# Patient Record
Sex: Female | Born: 1985 | Race: White | Hispanic: No | Marital: Married | Smoking: Never smoker
Health system: Southern US, Community
[De-identification: ages and names within clinical notes are randomized; demographics above are authoritative.]

## PROBLEM LIST (undated history)

## (undated) DIAGNOSIS — Z124 Encounter for screening for malignant neoplasm of cervix: Secondary | ICD-10-CM

## (undated) DIAGNOSIS — E669 Obesity, unspecified: Secondary | ICD-10-CM

## (undated) DIAGNOSIS — E781 Pure hyperglyceridemia: Secondary | ICD-10-CM

## (undated) DIAGNOSIS — E66811 Obesity, class 1: Secondary | ICD-10-CM

## (undated) DIAGNOSIS — C4491 Basal cell carcinoma of skin, unspecified: Secondary | ICD-10-CM

## (undated) DIAGNOSIS — I10 Essential (primary) hypertension: Secondary | ICD-10-CM

## (undated) DIAGNOSIS — E039 Hypothyroidism, unspecified: Secondary | ICD-10-CM

## (undated) DIAGNOSIS — K219 Gastro-esophageal reflux disease without esophagitis: Secondary | ICD-10-CM

## (undated) DIAGNOSIS — E282 Polycystic ovarian syndrome: Secondary | ICD-10-CM

## (undated) HISTORY — DX: Obesity, class 1: E66.811

## (undated) HISTORY — DX: Pure hyperglyceridemia: E78.1

## (undated) HISTORY — DX: Obesity, unspecified: E66.9

## (undated) HISTORY — PX: WISDOM TOOTH EXTRACTION: SHX21

## (undated) HISTORY — DX: Encounter for screening for malignant neoplasm of cervix: Z12.4

## (undated) HISTORY — DX: Polycystic ovarian syndrome: E28.2

## (undated) HISTORY — DX: Essential (primary) hypertension: I10

## (undated) HISTORY — DX: Hypercalcemia: E83.52

## (undated) HISTORY — DX: Basal cell carcinoma of skin, unspecified: C44.91

---

## 2013-07-24 DIAGNOSIS — N80109 Endometriosis of ovary, unspecified side, unspecified depth: Secondary | ICD-10-CM | POA: Insufficient documentation

## 2013-11-26 HISTORY — PX: ENDOMETRIAL FULGURATION: SHX1500

## 2015-03-25 LAB — OB RESULTS CONSOLE ABO/RH: RH TYPE: NEGATIVE

## 2015-03-25 LAB — OB RESULTS CONSOLE HIV ANTIBODY (ROUTINE TESTING): HIV: NONREACTIVE

## 2015-03-25 LAB — OB RESULTS CONSOLE HEPATITIS B SURFACE ANTIGEN: Hepatitis B Surface Ag: NEGATIVE

## 2015-03-25 LAB — OB RESULTS CONSOLE RPR: RPR: NONREACTIVE

## 2015-03-25 LAB — OB RESULTS CONSOLE RUBELLA ANTIBODY, IGM: RUBELLA: IMMUNE

## 2015-09-09 ENCOUNTER — Inpatient Hospital Stay (HOSPITAL_COMMUNITY): Payer: BC Managed Care – PPO

## 2015-09-09 ENCOUNTER — Inpatient Hospital Stay (HOSPITAL_COMMUNITY): Payer: BC Managed Care – PPO | Admitting: Anesthesiology

## 2015-09-09 ENCOUNTER — Inpatient Hospital Stay (HOSPITAL_COMMUNITY)
Admission: AD | Admit: 2015-09-09 | Discharge: 2015-09-12 | DRG: 766 | Disposition: A | Discharge status: Home / Self Care | Payer: BC Managed Care – PPO | Source: Ambulatory Visit | Attending: Obstetrics and Gynecology | Admitting: Obstetrics and Gynecology

## 2015-09-09 ENCOUNTER — Encounter (HOSPITAL_COMMUNITY): Payer: Self-pay | Admitting: *Deleted

## 2015-09-09 ENCOUNTER — Encounter (HOSPITAL_COMMUNITY)
Admission: AD | Disposition: A | Discharge status: Home / Self Care | Payer: Self-pay | Source: Ambulatory Visit | Attending: Obstetrics and Gynecology

## 2015-09-09 DIAGNOSIS — E039 Hypothyroidism, unspecified: Secondary | ICD-10-CM | POA: Diagnosis present

## 2015-09-09 DIAGNOSIS — O99284 Endocrine, nutritional and metabolic diseases complicating childbirth: Secondary | ICD-10-CM | POA: Diagnosis present

## 2015-09-09 DIAGNOSIS — O4400 Placenta previa specified as without hemorrhage, unspecified trimester: Secondary | ICD-10-CM | POA: Diagnosis present

## 2015-09-09 DIAGNOSIS — O9962 Diseases of the digestive system complicating childbirth: Secondary | ICD-10-CM | POA: Diagnosis present

## 2015-09-09 DIAGNOSIS — O114 Pre-existing hypertension with pre-eclampsia, complicating childbirth: Secondary | ICD-10-CM | POA: Diagnosis present

## 2015-09-09 DIAGNOSIS — K219 Gastro-esophageal reflux disease without esophagitis: Secondary | ICD-10-CM | POA: Diagnosis present

## 2015-09-09 DIAGNOSIS — O4403 Placenta previa specified as without hemorrhage, third trimester: Secondary | ICD-10-CM

## 2015-09-09 DIAGNOSIS — O1413 Severe pre-eclampsia, third trimester: Secondary | ICD-10-CM | POA: Diagnosis not present

## 2015-09-09 DIAGNOSIS — O4423 Partial placenta previa NOS or without hemorrhage, third trimester: Secondary | ICD-10-CM | POA: Diagnosis present

## 2015-09-09 DIAGNOSIS — Z3A36 36 weeks gestation of pregnancy: Secondary | ICD-10-CM

## 2015-09-09 DIAGNOSIS — R030 Elevated blood-pressure reading, without diagnosis of hypertension: Secondary | ICD-10-CM | POA: Diagnosis present

## 2015-09-09 HISTORY — DX: Hypothyroidism, unspecified: E03.9

## 2015-09-09 HISTORY — DX: Gastro-esophageal reflux disease without esophagitis: K21.9

## 2015-09-09 LAB — CBC
HEMATOCRIT: 36.3 % (ref 36.0–46.0)
Hemoglobin: 11.4 g/dL — ABNORMAL LOW (ref 12.0–15.0)
MCH: 25.9 pg — AB (ref 26.0–34.0)
MCHC: 31.4 g/dL (ref 30.0–36.0)
MCV: 82.3 fL (ref 78.0–100.0)
PLATELETS: 191 10*3/uL (ref 150–400)
RBC: 4.41 MIL/uL (ref 3.87–5.11)
RDW: 17.7 % — AB (ref 11.5–15.5)
WBC: 12.4 10*3/uL — ABNORMAL HIGH (ref 4.0–10.5)

## 2015-09-09 LAB — URINALYSIS, ROUTINE W REFLEX MICROSCOPIC
BILIRUBIN URINE: NEGATIVE
GLUCOSE, UA: NEGATIVE mg/dL
Ketones, ur: NEGATIVE mg/dL
Leukocytes, UA: NEGATIVE
Nitrite: NEGATIVE
PROTEIN: 30 mg/dL — AB
Specific Gravity, Urine: 1.01 (ref 1.005–1.030)
UROBILINOGEN UA: 0.2 mg/dL (ref 0.0–1.0)
pH: 6.5 (ref 5.0–8.0)

## 2015-09-09 LAB — PROTEIN / CREATININE RATIO, URINE
CREATININE, URINE: 59 mg/dL
Protein Creatinine Ratio: 1.15 mg/mg{Cre} — ABNORMAL HIGH (ref 0.00–0.15)
Total Protein, Urine: 68 mg/dL

## 2015-09-09 LAB — COMPREHENSIVE METABOLIC PANEL
ALT: 24 U/L (ref 14–54)
AST: 44 U/L — AB (ref 15–41)
Albumin: 2.7 g/dL — ABNORMAL LOW (ref 3.5–5.0)
Alkaline Phosphatase: 148 U/L — ABNORMAL HIGH (ref 38–126)
Anion gap: 5 (ref 5–15)
BILIRUBIN TOTAL: 0.8 mg/dL (ref 0.3–1.2)
BUN: 11 mg/dL (ref 6–20)
CHLORIDE: 112 mmol/L — AB (ref 101–111)
CO2: 20 mmol/L — ABNORMAL LOW (ref 22–32)
CREATININE: 0.89 mg/dL (ref 0.44–1.00)
Calcium: 10.1 mg/dL (ref 8.9–10.3)
Glucose, Bld: 78 mg/dL (ref 65–99)
POTASSIUM: 4.1 mmol/L (ref 3.5–5.1)
Sodium: 137 mmol/L (ref 135–145)
TOTAL PROTEIN: 6.5 g/dL (ref 6.5–8.1)

## 2015-09-09 LAB — URINE MICROSCOPIC-ADD ON

## 2015-09-09 LAB — OB RESULTS CONSOLE GBS: GBS: NEGATIVE

## 2015-09-09 LAB — URIC ACID: URIC ACID, SERUM: 6.6 mg/dL (ref 2.3–6.6)

## 2015-09-09 LAB — PREPARE RBC (CROSSMATCH)

## 2015-09-09 LAB — LACTATE DEHYDROGENASE: LDH: 307 U/L — AB (ref 98–192)

## 2015-09-09 SURGERY — Surgical Case
Anesthesia: Spinal | Site: Abdomen

## 2015-09-09 MED ORDER — ONDANSETRON HCL 4 MG/2ML IJ SOLN
INTRAMUSCULAR | Status: DC | PRN
  Administered 2015-09-09: 4 mg via INTRAVENOUS

## 2015-09-09 MED ORDER — LACTATED RINGERS IV SOLN
INTRAVENOUS | Status: DC
  Administered 2015-09-09: 17:00:00 via INTRAVENOUS

## 2015-09-09 MED ORDER — LACTATED RINGERS IV SOLN: INTRAVENOUS | Status: DC

## 2015-09-09 MED ORDER — MAGNESIUM SULFATE BOLUS VIA INFUSION
4.0000 g | Freq: Once | INTRAVENOUS | Status: AC
  Administered 2015-09-09: 4 g via INTRAVENOUS
  Filled 2015-09-09: qty 500

## 2015-09-09 MED ORDER — MORPHINE SULFATE (PF) 0.5 MG/ML IJ SOLN
INTRAMUSCULAR | Status: AC
  Filled 2015-09-09: qty 100

## 2015-09-09 MED ORDER — DEXAMETHASONE SODIUM PHOSPHATE 4 MG/ML IJ SOLN
INTRAMUSCULAR | Status: AC
  Filled 2015-09-09: qty 1

## 2015-09-09 MED ORDER — ONDANSETRON HCL 4 MG/2ML IJ SOLN
INTRAMUSCULAR | Status: AC
  Filled 2015-09-09: qty 2

## 2015-09-09 MED ORDER — FENTANYL CITRATE (PF) 100 MCG/2ML IJ SOLN
INTRAMUSCULAR | Status: AC
  Filled 2015-09-09: qty 4

## 2015-09-09 MED ORDER — PHENYLEPHRINE 8 MG IN D5W 100 ML (0.08MG/ML) PREMIX OPTIME
INJECTION | INTRAVENOUS | Status: DC | PRN
  Administered 2015-09-09: 60 ug/min via INTRAVENOUS

## 2015-09-09 MED ORDER — OXYTOCIN 10 UNIT/ML IJ SOLN
40.0000 [IU] | INTRAVENOUS | Status: DC | PRN
  Administered 2015-09-09: 40 [IU] via INTRAVENOUS

## 2015-09-09 MED ORDER — PHENYLEPHRINE 8 MG IN D5W 100 ML (0.08MG/ML) PREMIX OPTIME
INJECTION | INTRAVENOUS | Status: AC
  Filled 2015-09-09: qty 100

## 2015-09-09 MED ORDER — LABETALOL HCL 5 MG/ML IV SOLN
20.0000 mg | INTRAVENOUS | Status: AC | PRN
  Administered 2015-09-09: 40 mg via INTRAVENOUS
  Administered 2015-09-09: 20 mg via INTRAVENOUS
  Administered 2015-09-09: 80 mg via INTRAVENOUS
  Filled 2015-09-09: qty 8
  Filled 2015-09-09: qty 16
  Filled 2015-09-09: qty 4

## 2015-09-09 MED ORDER — CITRIC ACID-SODIUM CITRATE 334-500 MG/5ML PO SOLN
ORAL | Status: AC
  Filled 2015-09-09: qty 15

## 2015-09-09 MED ORDER — DEXAMETHASONE SODIUM PHOSPHATE 4 MG/ML IJ SOLN
INTRAMUSCULAR | Status: DC | PRN
  Administered 2015-09-09: 4 mg via INTRAVENOUS

## 2015-09-09 MED ORDER — SODIUM CHLORIDE 0.9 % IV SOLN: Freq: Once | INTRAVENOUS | Status: DC

## 2015-09-09 MED ORDER — CEFAZOLIN SODIUM-DEXTROSE 2-3 GM-% IV SOLR
2.0000 g | INTRAVENOUS | Status: AC
  Administered 2015-09-09: 2 g via INTRAVENOUS
  Filled 2015-09-09: qty 50

## 2015-09-09 MED ORDER — FENTANYL CITRATE (PF) 100 MCG/2ML IJ SOLN
INTRAMUSCULAR | Status: DC | PRN
  Administered 2015-09-09: 10 ug via INTRAVENOUS

## 2015-09-09 MED ORDER — OXYTOCIN 10 UNIT/ML IJ SOLN
INTRAMUSCULAR | Status: AC
  Filled 2015-09-09: qty 4

## 2015-09-09 MED ORDER — PHENYLEPHRINE HCL 10 MG/ML IJ SOLN
INTRAMUSCULAR | Status: DC | PRN
  Administered 2015-09-09 (×2): 40 ug via INTRAVENOUS

## 2015-09-09 MED ORDER — SODIUM CHLORIDE 0.9 % IR SOLN
Status: DC | PRN
  Administered 2015-09-09: 1000 mL

## 2015-09-09 MED ORDER — CITRIC ACID-SODIUM CITRATE 334-500 MG/5ML PO SOLN: 30.0000 mL | Freq: Once | ORAL | Status: DC

## 2015-09-09 MED ORDER — SCOPOLAMINE 1 MG/3DAYS TD PT72
MEDICATED_PATCH | TRANSDERMAL | Status: AC
  Filled 2015-09-09: qty 1

## 2015-09-09 MED ORDER — FAMOTIDINE IN NACL 20-0.9 MG/50ML-% IV SOLN
20.0000 mg | Freq: Once | INTRAVENOUS | Status: AC
  Administered 2015-09-09: 20 mg via INTRAVENOUS

## 2015-09-09 MED ORDER — MAGNESIUM SULFATE 50 % IJ SOLN
2.0000 g/h | INTRAVENOUS | Status: DC
  Administered 2015-09-09 – 2015-09-10 (×2): 2 g/h via INTRAVENOUS
  Filled 2015-09-09 (×2): qty 80

## 2015-09-09 MED ORDER — HYDRALAZINE HCL 20 MG/ML IJ SOLN
10.0000 mg | Freq: Once | INTRAMUSCULAR | Status: AC | PRN
  Administered 2015-09-09: 10 mg via INTRAVENOUS
  Filled 2015-09-09: qty 1

## 2015-09-09 MED ORDER — MORPHINE SULFATE (PF) 0.5 MG/ML IJ SOLN
INTRAMUSCULAR | Status: DC | PRN
  Administered 2015-09-09: .2 mg via EPIDURAL

## 2015-09-09 MED ORDER — FAMOTIDINE IN NACL 20-0.9 MG/50ML-% IV SOLN
INTRAVENOUS | Status: AC
  Administered 2015-09-09: 20 mg via INTRAVENOUS
  Filled 2015-09-09: qty 50

## 2015-09-09 MED ORDER — LIDOCAINE HCL (PF) 1 % IJ SOLN
INTRAMUSCULAR | Status: AC
  Filled 2015-09-09: qty 10

## 2015-09-09 SURGICAL SUPPLY — 35 items
APL SKNCLS STERI-STRIP NONHPOA (GAUZE/BANDAGES/DRESSINGS) ×1
BENZOIN TINCTURE PRP APPL 2/3 (GAUZE/BANDAGES/DRESSINGS) ×2 IMPLANT
CLAMP CORD UMBIL (MISCELLANEOUS) IMPLANT
CLOSURE WOUND 1/2 X4 (GAUZE/BANDAGES/DRESSINGS) ×1
CLOTH BEACON ORANGE TIMEOUT ST (SAFETY) ×3 IMPLANT
DRAPE SHEET LG 3/4 BI-LAMINATE (DRAPES) IMPLANT
DRSG OPSITE POSTOP 4X10 (GAUZE/BANDAGES/DRESSINGS) ×3 IMPLANT
DURAPREP 26ML APPLICATOR (WOUND CARE) ×3 IMPLANT
ELECT REM PT RETURN 9FT ADLT (ELECTROSURGICAL) ×3
ELECTRODE REM PT RTRN 9FT ADLT (ELECTROSURGICAL) ×1 IMPLANT
EXTRACTOR VACUUM KIWI (MISCELLANEOUS) IMPLANT
GLOVE BIO SURGEON STRL SZ 6.5 (GLOVE) ×2 IMPLANT
GLOVE BIO SURGEONS STRL SZ 6.5 (GLOVE) ×1
GOWN STRL REUS W/TWL LRG LVL3 (GOWN DISPOSABLE) ×6 IMPLANT
KIT ABG SYR 3ML LUER SLIP (SYRINGE) IMPLANT
NDL HYPO 25X5/8 SAFETYGLIDE (NEEDLE) IMPLANT
NEEDLE HYPO 25X5/8 SAFETYGLIDE (NEEDLE) IMPLANT
NS IRRIG 1000ML POUR BTL (IV SOLUTION) ×3 IMPLANT
PACK C SECTION WH (CUSTOM PROCEDURE TRAY) ×3 IMPLANT
PAD OB MATERNITY 4.3X12.25 (PERSONAL CARE ITEMS) ×3 IMPLANT
PENCIL SMOKE EVAC W/HOLSTER (ELECTROSURGICAL) ×3 IMPLANT
RTRCTR C-SECT PINK 25CM LRG (MISCELLANEOUS) ×3 IMPLANT
STRIP CLOSURE SKIN 1/2X4 (GAUZE/BANDAGES/DRESSINGS) ×1 IMPLANT
SUT CHROMIC 1 CTX 36 (SUTURE) ×6 IMPLANT
SUT PLAIN 0 NONE (SUTURE) IMPLANT
SUT PLAIN 2 0 XLH (SUTURE) ×3 IMPLANT
SUT VIC AB 0 CT1 27 (SUTURE) ×6
SUT VIC AB 0 CT1 27XBRD ANBCTR (SUTURE) ×2 IMPLANT
SUT VIC AB 2-0 CT1 27 (SUTURE) ×3
SUT VIC AB 2-0 CT1 TAPERPNT 27 (SUTURE) ×1 IMPLANT
SUT VIC AB 3-0 CT1 27 (SUTURE)
SUT VIC AB 3-0 CT1 TAPERPNT 27 (SUTURE) IMPLANT
SUT VIC AB 4-0 KS 27 (SUTURE) ×3 IMPLANT
TOWEL OR 17X24 6PK STRL BLUE (TOWEL DISPOSABLE) ×3 IMPLANT
TRAY FOLEY CATH SILVER 14FR (SET/KITS/TRAYS/PACK) ×3 IMPLANT

## 2015-09-09 NOTE — MAU Provider Note (Signed)
History     CSN: [id number]  Arrival date and time: 09/09/15 1528   First Provider Initiated Contact with Patient 09/09/15 1607         Chief Complaint  Patient presents with  . Hypertension   HPI  Megan Webster is a 29 y.o. G2P0010 at [redacted]w[redacted]d who was sent from office for elevated BPs.  Taking labetalol PO for htn.  Denies headache, vision changes, epigastric pain, chest pain, or SOB.  Feeling some irregular abdominal tightening but no pain.  Denies vaginal bleeding or LOF.  Positive fetal movement.  Has known placental previa.   OB History    Gravida Para Term Preterm AB TAB SAB Ectopic Multiple Living   2    1  1          Past Medical History  Diagnosis Date  . GERD (gastroesophageal reflux disease)   . Hypothyroidism     Past Surgical History  Procedure Laterality Date  . Wisdom tooth extraction    . Endometrial fulguration      No family history on file.  Social History  Substance Use Topics  . Smoking status: Never Smoker   . Smokeless tobacco: None  . Alcohol Use: No    Allergies: Allergies not on file  No prescriptions prior to admission    Review of Systems  Constitutional: Negative.   Eyes: Negative for blurred vision.  Respiratory: Negative.   Cardiovascular: Negative.   Gastrointestinal: Negative.   Genitourinary: Negative.   Neurological: Negative for dizziness and headaches.   Physical Exam   Blood pressure 180/105, pulse 81, temperature 98 F (36.7 C), resp. rate 18.   Patient Vitals for the past 24 hrs:  BP Temp Pulse Resp  09/09/15 1917 (!) 171/107 mmHg - 76 -  09/09/15 1731 162/96 mmHg - 80 -  09/09/15 1716 152/93 mmHg - 82 -  09/09/15 1702 161/98 mmHg - 82 -  09/09/15 1700 (!) 164/101 mmHg - 79 -  09/09/15 1646 (!) 163/106 mmHg - 79 -  09/09/15 1642 (!) 177/102 mmHg - 79 -  09/09/15 1631 (!) 171/111 mmHg - 79 -  09/09/15 1616 (!) 187/115 mmHg - 87 -  09/09/15 1601 (!) 180/105 mmHg - 81 -  09/09/15 1552 (!) 181/107  mmHg 98 F (36.7 C) 83 18     Physical Exam  Nursing note and vitals reviewed. Constitutional: She is oriented to person, place, and time. She appears well-developed and well-nourished. No distress.  HENT:  Head: Normocephalic and atraumatic.  Eyes: Conjunctivae are normal. Right eye exhibits no discharge. Left eye exhibits no discharge. No scleral icterus.  Neck: Normal range of motion.  Cardiovascular: Normal rate, regular rhythm and normal heart sounds.   No murmur heard. Respiratory: Effort normal and breath sounds normal. No respiratory distress. She has no wheezes.  GI: Soft.  Musculoskeletal: She exhibits edema (mild edema BLE).  Neurological: She is alert and oriented to person, place, and time.  Reflex Scores:      Patellar reflexes are 2+ on the right side and 2+ on the left side. No clonus  Skin: Skin is warm and dry. She is not diaphoretic.  Psychiatric: She has a normal mood and affect. Her behavior is normal. Judgment and thought content normal.   Fetal Tracing:  Baseline: 140 Variability: moderate Accelerations: 15x15 Decelerations: none  Toco: 2-4, mild   MAU Course  Procedures . Results for orders placed or performed during the hospital encounter of 09/09/15 (from the past 24 hour(s))  Urinalysis, Routine w reflex microscopic (not at [facility])     Status: Abnormal   Collection Time: 09/09/15  3:40 PM  Result Value Ref Range   Color, Urine YELLOW YELLOW   APPearance CLEAR CLEAR   Specific Gravity, Urine 1.010 1.005 - 1.030   pH 6.5 5.0 - 8.0   Glucose, UA NEGATIVE NEGATIVE mg/dL   Hgb urine dipstick TRACE (A) NEGATIVE   Bilirubin Urine NEGATIVE NEGATIVE   Ketones, ur NEGATIVE NEGATIVE mg/dL   Protein, ur 30 (A) NEGATIVE mg/dL   Urobilinogen, UA 0.2 0.0 - 1.0 mg/dL   Nitrite NEGATIVE NEGATIVE   Leukocytes, UA NEGATIVE NEGATIVE  Protein / creatinine ratio, urine     Status: Abnormal   Collection Time: 09/09/15  3:40 PM  Result Value Ref Range    Creatinine, Urine 59.00 mg/dL   Total Protein, Urine 68 mg/dL   Protein Creatinine Ratio 1.15 (H) 0.00 - 0.15 mg/mg[Cre]  Urine microscopic-add on     Status: Abnormal   Collection Time: 09/09/15  3:40 PM  Result Value Ref Range   Squamous Epithelial / LPF FEW (A) RARE   WBC, UA 0-2 <3 WBC/hpf  CBC     Status: Abnormal   Collection Time: 09/09/15  4:20 PM  Result Value Ref Range   WBC 12.4 (H) 4.0 - 10.5 K/uL   RBC 4.41 3.87 - 5.11 MIL/uL   Hemoglobin 11.4 (L) 12.0 - 15.0 g/dL   HCT 36.3 36.0 - 46.0 %   MCV 82.3 78.0 - 100.0 fL   MCH 25.9 (L) 26.0 - 34.0 pg   MCHC 31.4 30.0 - 36.0 g/dL   RDW 17.7 (H) 11.5 - 15.5 %   Platelets 191 150 - 400 K/uL  Comprehensive metabolic panel     Status: Abnormal   Collection Time: 09/09/15  4:20 PM  Result Value Ref Range   Sodium 137 135 - 145 mmol/L   Potassium 4.1 3.5 - 5.1 mmol/L   Chloride 112 (H) 101 - 111 mmol/L   CO2 20 (L) 22 - 32 mmol/L   Glucose, Bld 78 65 - 99 mg/dL   BUN 11 6 - 20 mg/dL   Creatinine, Ser 0.89 0.44 - 1.00 mg/dL   Calcium 10.1 8.9 - 10.3 mg/dL   Total Protein 6.5 6.5 - 8.1 g/dL   Albumin 2.7 (L) 3.5 - 5.0 g/dL   AST 44 (H) 15 - 41 U/L   ALT 24 14 - 54 U/L   Alkaline Phosphatase 148 (H) 38 - 126 U/L   Total Bilirubin 0.8 0.3 - 1.2 mg/dL   GFR calc non Af Amer >60 >60 mL/min   GFR calc Af Amer >60 >60 mL/min   Anion gap 5 5 - 15  Lactate dehydrogenase     Status: Abnormal   Collection Time: 09/09/15  4:20 PM  Result Value Ref Range   LDH 307 (H) 98 - 192 U/L  Uric acid     Status: None   Collection Time: 09/09/15  4:20 PM  Result Value Ref Range   Uric Acid, Serum 6.6 2.3 - 6.6 mg/dL    MDM PIH labs ordered Labetalol & IV protocol ordered for severe range BPs - BPs decreased after 3 escalating doses of labetalol Ultrasound ordered per Dr. Marvel Plan to evaluate placental previa Pt returned from ultrasound with severe range BP - hydralazine given Dr. Marvel Plan notified of labs, ultrasound, & BPs.   Ultrasound - marginal previa Assessment and Plan  A: 1. Placenta previa without hemorrhage  in third trimester   2. Severe preeclampsia, third trimester    P; Pt to be admitted for c/section per Dr. Jinger Neighbors, NP  09/09/2015, 4:07 PM

## 2015-09-09 NOTE — MAU Note (Signed)
Pt sent over from office for elevated b/ps. Denies headache or pain at this time.

## 2015-09-09 NOTE — Consult Note (Signed)
Neonatology Note:   Attendance at C-section:    I was asked by Dr. Marvel Plan to attend this primary C/S at 36 2/7 weeks due to placenta previa and PIH. The mother is a G2P0A1 A neg, GBS neg with worsening PIH, on Labetalol and Mg sulfate, and hypothyroidism, on Synthroid. ROM at delivery, fluid clear. Vacuum-assisted delivery. Infant with decreased tone and irregular respirations at birth. He appeared very mottled and pale and his HR dipped below 80 intermittently when breathing irregularly. We bulb suctioned and gave stimulation; at about 2 minutes, I applied PPV for about 20 seconds with improvement in regularity of breathing. We placed a pulse oximeter and, although color and perfusion were improving, his O2 saturation was 63% at 6 minutes, so we gave BBO2 for about 2 minutes. He had subcostal retractions, nasal flaring, and mild grunting, but was moving air well on auscultation.  Ap 4/9. Lungs clear to ausc in DR. Held by parents briefly, then transported to the NICU for further care.   Real Cons, MD

## 2015-09-09 NOTE — H&P (Addendum)
Megan Webster is a 29 y.o. female G2P0010 at 55 2/7 weeks (EDD 10/05/15 by 12 week [place] inconsistent with LMP) presenting for evaluation of elevated BP in office 162/116.  Pt's prenatal care complicated by a history of CHTN diagnosed by elevated BP in the first and second trimesters.  At 21 weeks, the BP was noted to be 160/104 and she was started on labetalol 100mg  po BID.  This brought her BP down to 140-160/90-100 range consistently until today the BP was much more elevated.  She is aymptomatic with no HA or other PIH  sx.  She had 2+ proteinuria on dipstick and turned in a 24 hour urine today--results not yet available.  Prenatal care also complicated by a persistent marginal placenta previa, last scan 08/19/15 it persisted with normal growth at the 39%ile and normal AFI.  She has hypothyroidism managed and stable on her levothyroxine.  She had a h/o PCOS and had been on metformin in the past.  An early glucola and glucola at 28 weeks was WNL off the metformin.  She is Rh negative but husband is also so declined rhogam.    Maternal Medical History:  Contractions: Frequency: rare.    Fetal activity: Perceived fetal activity is normal.    Prenatal complications: PIH.     OB History    Gravida Para Term Preterm AB TAB SAB Ectopic Multiple Living   2    1  1        SAB x 1  Past Medical History  Diagnosis Date  . GERD (gastroesophageal reflux disease)   . Hypothyroidism    Past Surgical History  Procedure Laterality Date  . Wisdom tooth extraction    . Endometrial fulguration     Family History: family history is not on file. Social History:  reports that she has never smoked. She does not have any smokeless tobacco history on file. She reports that she does not drink alcohol or use illicit drugs.   Prenatal Transfer Tool  Maternal Diabetes: No Genetic Screening: Normal Maternal Ultrasounds/Referrals: Normal Fetal Ultrasounds or other Referrals:  None Maternal Substance Abuse:   No Significant Maternal Medications:  Meds include: Syntroid Other: labetalol Significant Maternal Lab Results:  Lab values include: Rh negative Other Comments:  None  Review of Systems  Gastrointestinal: Negative for abdominal pain.  Neurological: Negative for headaches.      Blood pressure 162/96, pulse 80, temperature 98 F (36.7 C), resp. rate 18. Maternal Exam:  Uterine Assessment: Contraction strength is mild.  Contraction frequency is rare.   Abdomen: Fetal presentation: vertex  Introitus: Normal vulva. Normal vagina.    Physical Exam  Constitutional: She is oriented to person, place, and time. She appears well-developed and well-nourished.  Cardiovascular: Normal rate and regular rhythm.   Respiratory: Effort normal.  GI: Soft.  Genitourinary: Vagina normal.  Neurological: She is alert and oriented to person, place, and time.  Psychiatric: She has a normal mood and affect. Her behavior is normal.    AST/ALT  44/24 Plts 191 Creatinine 0.89 Prot:creatinine ratio 1.15  Prenatal labs: ABO, Rh:  A neg Antibody:  neg Rubella:  Immune RPR:   NR HBsAg:   Neg HIV:   NR GBS:   Neg First trimester screen and AFP negative One hour GTT 117,113 CF negative  Assessment/Plan: Pt with proteinuria and worsening BP --superimposed preeclampsia with underlying CHTN.  [place] tonight confirms still with a marginal previa.  Bringing BP under control with labetalol and hydralazine.  D/w Dr.  Whitecar and he suggests proceeding with delivery today.  Since > 36 weeks would not delay for steroids.  Will prep patient for c-section and proceed when NPO 8 hours.    Logan Bores 09/09/2015, 6:24 PM    Pt counseled on risks and benefits of c-section including bleeding, infection and possible damage to bowel and bladder.  She would accept a blood transfusion if needed.  Ready to proceed when OR ready.

## 2015-09-09 NOTE — Anesthesia Preprocedure Evaluation (Signed)
Anesthesia Evaluation  Patient identified by MRN, date of birth, ID band Patient awake    Reviewed: Allergy & Precautions, H&P , NPO status , Patient's Chart, lab work & pertinent test results, reviewed documented beta blocker date and time   Airway Mallampati: II  TM Distance: >3 FB Neck ROM: full    Dental no notable dental hx.    Pulmonary neg pulmonary ROS,    Pulmonary exam normal breath sounds clear to auscultation       Cardiovascular negative cardio ROS Normal cardiovascular exam Rhythm:regular Rate:Normal     Neuro/Psych negative neurological ROS  negative psych ROS   GI/Hepatic Neg liver ROS, GERD  ,  Endo/Other  Hypothyroidism   Renal/GU negative Renal ROS  negative genitourinary   Musculoskeletal   Abdominal   Peds  Hematology negative hematology ROS (+)   Anesthesia Other Findings Pregnancy - complicated currently with concern for pre-eclampsia, being treated with magnesium and labetalol  Platelets and allergies reviewed Denies active cardiac or pulmonary symptoms, METS > 4  Denies blood thinning medications, bleeding disorders, asthma, supine hypotension syndrome, previous anesthesia difficulties    Reproductive/Obstetrics (+) Pregnancy                             Anesthesia Physical Anesthesia Plan  ASA: III  Anesthesia Plan: Spinal   Post-op Pain Management:    Induction:   Airway Management Planned:   Additional Equipment:   Intra-op Plan:   Post-operative Plan:   Informed Consent: I have reviewed the patients History and Physical, chart, labs and discussed the procedure including the risks, benefits and alternatives for the proposed anesthesia with the patient or authorized representative who has indicated his/her understanding and acceptance.   Dental advisory given  Plan Discussed with: Anesthesiologist and CRNA  Anesthesia Plan Comments: (Patient  needs active type and screen before C section given concern over partial placenta previa)        Anesthesia Quick Evaluation

## 2015-09-10 ENCOUNTER — Encounter (HOSPITAL_COMMUNITY): Payer: Self-pay | Admitting: General Practice

## 2015-09-10 DIAGNOSIS — O4400 Placenta previa specified as without hemorrhage, unspecified trimester: Secondary | ICD-10-CM | POA: Diagnosis present

## 2015-09-10 LAB — COMPREHENSIVE METABOLIC PANEL
ALBUMIN: 2.2 g/dL — AB (ref 3.5–5.0)
ALK PHOS: 112 U/L (ref 38–126)
ALT: 19 U/L (ref 14–54)
AST: 25 U/L (ref 15–41)
Anion gap: 3 — ABNORMAL LOW (ref 5–15)
BILIRUBIN TOTAL: 0.3 mg/dL (ref 0.3–1.2)
BUN: 13 mg/dL (ref 6–20)
CALCIUM: 8.1 mg/dL — AB (ref 8.9–10.3)
CO2: 25 mmol/L (ref 22–32)
CREATININE: 0.97 mg/dL (ref 0.44–1.00)
Chloride: 107 mmol/L (ref 101–111)
GFR calc Af Amer: >60 mL/min (ref >60)
GFR calc non Af Amer: >60 mL/min (ref >60)
GLUCOSE: 88 mg/dL (ref 65–99)
Potassium: 4 mmol/L (ref 3.5–5.1)
Sodium: 135 mmol/L (ref 135–145)
TOTAL PROTEIN: 5.7 g/dL — AB (ref 6.5–8.1)

## 2015-09-10 LAB — CBC
HCT: 35.1 % — ABNORMAL LOW (ref 36.0–46.0)
HEMATOCRIT: 31 % — AB (ref 36.0–46.0)
HEMOGLOBIN: 10.8 g/dL — AB (ref 12.0–15.0)
HEMOGLOBIN: 9.8 g/dL — AB (ref 12.0–15.0)
MCH: 25.5 pg — ABNORMAL LOW (ref 26.0–34.0)
MCH: 26.3 pg (ref 26.0–34.0)
MCHC: 30.8 g/dL (ref 30.0–36.0)
MCHC: 31.6 g/dL (ref 30.0–36.0)
MCV: 82.8 fL (ref 78.0–100.0)
MCV: 83.3 fL (ref 78.0–100.0)
PLATELETS: 207 10*3/uL (ref 150–400)
Platelets: 188 10*3/uL (ref 150–400)
RBC: 4.24 MIL/uL (ref 3.87–5.11)
RBC: 3.72 MIL/uL — ABNORMAL LOW (ref 3.87–5.11)
RDW: 18.3 % — ABNORMAL HIGH (ref 11.5–15.5)
RDW: 18.2 % — ABNORMAL HIGH (ref 11.5–15.5)
WBC: 16 10*3/uL — ABNORMAL HIGH (ref 4.0–10.5)
WBC: 14.2 10*3/uL — AB (ref 4.0–10.5)

## 2015-09-10 LAB — ABO/RH: ABO/RH(D): A NEG

## 2015-09-10 LAB — MAGNESIUM: Magnesium: 7.7 mg/dL (ref 1.7–2.4)

## 2015-09-10 MED ORDER — KETOROLAC TROMETHAMINE 30 MG/ML IJ SOLN
30.0000 mg | Freq: Four times a day (QID) | INTRAMUSCULAR | Status: AC | PRN
  Filled 2015-09-10: qty 1

## 2015-09-10 MED ORDER — FENTANYL CITRATE (PF) 100 MCG/2ML IJ SOLN: 25.0000 ug | INTRAMUSCULAR | Status: DC | PRN

## 2015-09-10 MED ORDER — WITCH HAZEL-GLYCERIN EX PADS: 1.0000 "application " | MEDICATED_PAD | CUTANEOUS | Status: DC | PRN

## 2015-09-10 MED ORDER — NALOXONE HCL 1 MG/ML IJ SOLN
1.0000 ug/kg/h | INTRAVENOUS | Status: DC | PRN
  Filled 2015-09-10: qty 2

## 2015-09-10 MED ORDER — LABETALOL HCL 100 MG PO TABS
100.0000 mg | ORAL_TABLET | Freq: Two times a day (BID) | ORAL | Status: DC
  Administered 2015-09-10 – 2015-09-12 (×5): 100 mg via ORAL
  Filled 2015-09-10 (×5): qty 1

## 2015-09-10 MED ORDER — LACTATED RINGERS IV SOLN
INTRAVENOUS | Status: DC
  Administered 2015-09-10 (×2): via INTRAVENOUS

## 2015-09-10 MED ORDER — NALBUPHINE HCL 10 MG/ML IJ SOLN: 5.0000 mg | Freq: Once | INTRAMUSCULAR | Status: DC | PRN

## 2015-09-10 MED ORDER — ACETAMINOPHEN 500 MG PO TABS
1000.0000 mg | ORAL_TABLET | Freq: Four times a day (QID) | ORAL | Status: AC
  Administered 2015-09-10 (×4): 1000 mg via ORAL
  Filled 2015-09-10 (×4): qty 2

## 2015-09-10 MED ORDER — OXYTOCIN 40 UNITS IN LACTATED RINGERS INFUSION - SIMPLE MED: 62.5000 mL/h | INTRAVENOUS | Status: AC

## 2015-09-10 MED ORDER — NALBUPHINE HCL 10 MG/ML IJ SOLN: 5.0000 mg | INTRAMUSCULAR | Status: DC | PRN

## 2015-09-10 MED ORDER — SIMETHICONE 80 MG PO CHEW: 80.0000 mg | CHEWABLE_TABLET | ORAL | Status: DC | PRN

## 2015-09-10 MED ORDER — SCOPOLAMINE 1 MG/3DAYS TD PT72
1.0000 | MEDICATED_PATCH | Freq: Once | TRANSDERMAL | Status: AC
  Administered 2015-09-09: 1 via TRANSDERMAL

## 2015-09-10 MED ORDER — PRENATAL MULTIVITAMIN CH: 1.0000 | ORAL_TABLET | Freq: Every day | ORAL | Status: DC

## 2015-09-10 MED ORDER — MAGNESIUM SULFATE 50 % IJ SOLN
2.0000 g/h | INTRAVENOUS | Status: AC
  Filled 2015-09-10: qty 80

## 2015-09-10 MED ORDER — SODIUM CHLORIDE 0.9 % IJ SOLN
3.0000 mL | INTRAMUSCULAR | Status: DC | PRN
  Administered 2015-09-11: 3 mL via INTRAVENOUS
  Filled 2015-09-10: qty 3

## 2015-09-10 MED ORDER — SIMETHICONE 80 MG PO CHEW
80.0000 mg | CHEWABLE_TABLET | ORAL | Status: DC
  Administered 2015-09-10 – 2015-09-11 (×2): 80 mg via ORAL
  Filled 2015-09-10 (×2): qty 1

## 2015-09-10 MED ORDER — SIMETHICONE 80 MG PO CHEW
80.0000 mg | CHEWABLE_TABLET | Freq: Three times a day (TID) | ORAL | Status: DC
  Administered 2015-09-10 – 2015-09-12 (×6): 80 mg via ORAL
  Filled 2015-09-10 (×5): qty 1

## 2015-09-10 MED ORDER — PRENATAL MULTIVITAMIN CH
1.0000 | ORAL_TABLET | Freq: Every day | ORAL | Status: DC
  Administered 2015-09-10 – 2015-09-12 (×3): 1 via ORAL
  Filled 2015-09-10 (×3): qty 1

## 2015-09-10 MED ORDER — NALOXONE HCL 0.4 MG/ML IJ SOLN: 0.4000 mg | INTRAMUSCULAR | Status: DC | PRN

## 2015-09-10 MED ORDER — DIBUCAINE 1 % RE OINT: 1.0000 "application " | TOPICAL_OINTMENT | RECTAL | Status: DC | PRN

## 2015-09-10 MED ORDER — PROMETHAZINE HCL 25 MG/ML IJ SOLN: 6.2500 mg | INTRAMUSCULAR | Status: DC | PRN

## 2015-09-10 MED ORDER — SENNOSIDES-DOCUSATE SODIUM 8.6-50 MG PO TABS
2.0000 | ORAL_TABLET | ORAL | Status: DC
  Administered 2015-09-10 – 2015-09-11 (×2): 2 via ORAL
  Filled 2015-09-10 (×2): qty 2

## 2015-09-10 MED ORDER — DIPHENHYDRAMINE HCL 50 MG/ML IJ SOLN: 12.5000 mg | INTRAMUSCULAR | Status: DC | PRN

## 2015-09-10 MED ORDER — DIPHENHYDRAMINE HCL 25 MG PO CAPS: 25.0000 mg | ORAL_CAPSULE | Freq: Four times a day (QID) | ORAL | Status: DC | PRN

## 2015-09-10 MED ORDER — LANOLIN HYDROUS EX OINT: 1.0000 "application " | TOPICAL_OINTMENT | CUTANEOUS | Status: DC | PRN

## 2015-09-10 MED ORDER — DIPHENHYDRAMINE HCL 25 MG PO CAPS: 25.0000 mg | ORAL_CAPSULE | ORAL | Status: DC | PRN

## 2015-09-10 MED ORDER — KETOROLAC TROMETHAMINE 30 MG/ML IJ SOLN
30.0000 mg | Freq: Four times a day (QID) | INTRAMUSCULAR | Status: AC | PRN
  Administered 2015-09-10: 30 mg via INTRAMUSCULAR
  Filled 2015-09-10: qty 1

## 2015-09-10 MED ORDER — BUPIVACAINE IN DEXTROSE 0.75-8.25 % IT SOLN
INTRATHECAL | Status: DC | PRN
  Administered 2015-09-09: 1.8 mL via INTRATHECAL

## 2015-09-10 MED ORDER — IBUPROFEN 600 MG PO TABS
600.0000 mg | ORAL_TABLET | Freq: Four times a day (QID) | ORAL | Status: DC
  Administered 2015-09-10 – 2015-09-12 (×10): 600 mg via ORAL
  Filled 2015-09-10 (×10): qty 1

## 2015-09-10 MED ORDER — ONDANSETRON HCL 4 MG/2ML IJ SOLN: 4.0000 mg | Freq: Three times a day (TID) | INTRAMUSCULAR | Status: DC | PRN

## 2015-09-10 MED ORDER — LEVOTHYROXINE SODIUM 100 MCG PO TABS
100.0000 ug | ORAL_TABLET | Freq: Every day | ORAL | Status: DC
  Administered 2015-09-10 – 2015-09-12 (×3): 100 ug via ORAL
  Filled 2015-09-10 (×5): qty 1

## 2015-09-10 MED ORDER — MENTHOL 3 MG MT LOZG
1.0000 | LOZENGE | OROMUCOSAL | Status: DC | PRN
  Filled 2015-09-10: qty 9

## 2015-09-10 MED ORDER — MEPERIDINE HCL 25 MG/ML IJ SOLN: 6.2500 mg | INTRAMUSCULAR | Status: DC | PRN

## 2015-09-10 MED ORDER — ACETAMINOPHEN 325 MG PO TABS: 650.0000 mg | ORAL_TABLET | ORAL | Status: DC | PRN

## 2015-09-10 MED ORDER — KETOROLAC TROMETHAMINE 30 MG/ML IJ SOLN
INTRAMUSCULAR | Status: AC
  Filled 2015-09-10: qty 1

## 2015-09-10 MED ORDER — TETANUS-DIPHTH-ACELL PERTUSSIS 5-2.5-18.5 LF-MCG/0.5 IM SUSP
0.5000 mL | Freq: Once | INTRAMUSCULAR | Status: DC
  Filled 2015-09-10: qty 0.5

## 2015-09-10 MED ORDER — SCOPOLAMINE 1 MG/3DAYS TD PT72
MEDICATED_PATCH | TRANSDERMAL | Status: AC
  Filled 2015-09-10: qty 1

## 2015-09-10 MED ORDER — ZOLPIDEM TARTRATE 5 MG PO TABS: 5.0000 mg | ORAL_TABLET | Freq: Every evening | ORAL | Status: DC | PRN

## 2015-09-10 MED ORDER — BUPIVACAINE IN DEXTROSE 0.75-8.25 % IT SOLN
INTRATHECAL | Status: AC
  Filled 2015-09-10: qty 4

## 2015-09-10 MED ORDER — LACTATED RINGERS IV SOLN: INTRAVENOUS | Status: DC

## 2015-09-10 MED ORDER — OXYCODONE-ACETAMINOPHEN 5-325 MG PO TABS
2.0000 | ORAL_TABLET | ORAL | Status: DC | PRN
  Administered 2015-09-11 (×2): 2 via ORAL
  Filled 2015-09-10 (×2): qty 2

## 2015-09-10 MED ORDER — OXYCODONE-ACETAMINOPHEN 5-325 MG PO TABS
1.0000 | ORAL_TABLET | ORAL | Status: DC | PRN
  Administered 2015-09-12: 1 via ORAL
  Filled 2015-09-10: qty 1

## 2015-09-10 NOTE — Anesthesia Postprocedure Evaluation (Signed)
  Anesthesia Post-op Note  Patient: Megan Webster  Procedure(s) Performed: Procedure(s): CESAREAN SECTION (N/A)  Patient Location: Antenatal  Anesthesia Type:Spinal  Level of Consciousness: awake, alert , oriented and patient cooperative  Airway and Oxygen Therapy: Patient Spontanous Breathing  Post-op Pain: none  Post-op Assessment: Post-op Vital signs reviewed, Patient's Cardiovascular Status Stable, Respiratory Function Stable, Patent Airway, No headache, No backache and Patient able to bend at knees              Post-op Vital Signs: Reviewed and stable  Last Vitals:  Filed Vitals:   09/10/15 0700  BP: 142/104  Pulse: 89  Temp:   Resp:     Complications: No apparent anesthesia complications

## 2015-09-10 NOTE — Anesthesia Postprocedure Evaluation (Signed)
  Anesthesia Post-op Note  Patient: Megan Webster  Procedure(s) Performed: Procedure(s) (LRB): CESAREAN SECTION (N/A)  Patient Location: PACU  Anesthesia Type: Spinal  Level of Consciousness: awake and alert   Airway and Oxygen Therapy: Patient Spontanous Breathing  Post-op Pain: mild  Post-op Assessment: Post-op Vital signs reviewed, Patient's Cardiovascular Status Stable, Respiratory Function Stable, Patent Airway and No signs of Nausea or vomiting  Last Vitals:  Filed Vitals:   09/09/15 2201  BP: 154/84  Pulse: 89  Temp:   Resp:     Post-op Vital Signs: stable   Complications: No apparent anesthesia complications

## 2015-09-10 NOTE — Op Note (Addendum)
Operative Note    Preoperative Diagnosis Superimposed Preeclampsia with CHTN Marginal placenta previa Pregnancy at 36 2/7 weeks  Postoperative Diagnosis same  Procedure Primary Low Transverse c-section with 2 layer closure of uterus  Surgeon Paula Compton  Anesthesia Spinal  Fluids: EBL 800cc UOP 200cc clear IVF 1800ccLR  Findings Viable female infant in the vertex presentation.  Apgars 4,9.    Weight pending   Placenta previa.  Normal uterus, ovaries and tubes otherwise except right ovary adherent to cul-de-sac  Specimen Placenta sent to L&D  Procedure Note Procedure note  Patient was taken to the operating room where epidural anesthesia was found to be adequate by Allis clamp test. She was prepped and draped in the normal sterile fashion in the dorsal supine position with a leftward tilt. An appropriate time out was performed. A Pfannenstiel skin incision was then made with the scalpel and carried through to the underlying layer of fascia by sharp dissection and Bovie cautery. The fascia was nicked in the midline and the incision was extended laterally with Mayo scissors. The inferior aspect of the incision was grasped Coker clamps and dissected off the underlying rectus muscles. In a similar fashion the superior aspect was dissected off the rectus muscles. Rectus muscles were separated in the midline and the peritoneal cavity entered bluntly. The peritoneal incision was then extended both superiorly and inferiorly with careful attention to avoid both bowel and bladder. The Alexis self-retaining wound retractor was then placed within the incision and the lower uterine segment exposed. The bladder flap was developed with Metzenbaum scissors and pushed away from the lower uterine segment. The lower uterine segment was then incised in a transverse fashion and the cavity itself entered bluntly. The incision was extended bluntly. The superior edge of the placenta was encountered at  the incision with the baby's head behind it.  The vertex was floating, and required the Kiwi vacuum to guide it through the incision with one pop-off encountered from poor suction.The infant's head was then lifted and delivered from the incision without difficulty. The remainder of the infant delivered and the nose and mouth bulb suctioned with the cord clamped and cut as well. The infant was handed off to the waiting pediatricians. The placenta was then spontaneously expressed from the uterus and the uterus cleared of all clots and debris with moist lap sponge. The uterine incision was then repaired in 2 layers the first layer was a running locked layer 1-0 chromic and the second an imbricating layer of the same suture. The tubes and ovaries were inspected and the gutters cleared of all clots and debris. The uterine incision was inspected and found to be hemostatic. All instruments and sponges as well as the Alexis retractor were then removed from the abdomen. The rectus muscles and peritoneum were then reapproximated with a running  suture of 2-0 Vicryl. The fascia was then closed with 0 Vicryl in a running fashion. Subcutaneous tissue was reapproximated with 3-0 plain in a running fashion. The skin was closed with a subcuticular stitch of 4-0 Vicryl on a Keith needle and then reinforced with benzoin and Steri-Strips. At the conclusion of the procedure all instruments and sponge counts were correct. Patient was taken to the recovery room but the baby was taken to the NICU for some grunting.

## 2015-09-10 NOTE — Lactation Note (Signed)
This note was copied from the chart of Megan Kaila Devries. Lactation Consultation Note  Patient Name: Megan Webster [id number] Date: 09/10/2015 Reason for consult: Initial assessment;NICU baby;Infant < 6lbs;Late preterm infant RN had set up DEBP for Mom to use. Mom reports not receiving any colostrum at this point. She has not been pumping consistently yet. Basic teaching reviewed with Mom. Nicu booklet and Lactation brochure left for review. Advised Mom to pump every 3 hours for 15 minutes on preemie setting. Reviewed normal volume parameters for 1st 3 days. Reviewed storage of EBM and cleaning pump pieces.. Mom to call insurance about DEBP for home use. Advised of OP services and support group. Encouraged to call for questions/concerns.   Maternal Data Has patient been taught Hand Expression?: Yes Does the patient have breastfeeding experience prior to this delivery?: No  Feeding Feeding Type: Formula Length of feed: 30 min  LATCH Score/Interventions                      Lactation Tools Discussed/Used Tools: Pump Breast pump type: Double-Electric Breast Pump WIC Program: No   Consult Status Consult Status: Follow-up Date: 09/11/15 Follow-up type: In-patient    Katrine Coho 09/10/2015, 6:27 PM

## 2015-09-10 NOTE — Transfer of Care (Signed)
Immediate Anesthesia Transfer of Care Note  Patient: Megan Webster  Procedure(s) Performed: Procedure(s): CESAREAN SECTION (N/A)  Patient Location: PACU  Anesthesia Type:Spinal  Level of Consciousness: awake, alert  and oriented  Airway & Oxygen Therapy: Patient Spontanous Breathing  Post-op Assessment: Report given to RN and Post -op Vital signs reviewed and stable  Post vital signs: Reviewed and stable  Last Vitals:  Filed Vitals:   09/09/15 2201  BP: 154/84  Pulse: 89  Temp:   Resp:     Complications: No apparent anesthesia complications

## 2015-09-10 NOTE — Anesthesia Procedure Notes (Signed)
Spinal Patient location during procedure: OR Staffing Anesthesiologist: Honora Searson Performed by: anesthesiologist  Preanesthetic Checklist Completed: patient identified, site marked, surgical consent, pre-op evaluation, timeout performed, IV checked, risks and benefits discussed and monitors and equipment checked Spinal Block Patient position: sitting Prep: DuraPrep Patient monitoring: heart rate, continuous pulse ox and blood pressure Location: L4-5 Injection technique: single-shot Needle Needle type: Pencan  Needle gauge: 24 G Needle length: 9 cm Assessment Sensory level: T4 Additional Notes Functioning IV was confirmed and monitors were applied. Sterile prep and drape, including hand hygiene, mask and sterile gloves were used. The patient was positioned and the spine was prepped. The skin was anesthetized with lidocaine.  Free flow of clear CSF was obtained prior to injecting local anesthetic into the CSF.  The spinal needle aspirated freely following injection.  The needle was carefully withdrawn.  The patient tolerated the procedure well. Consent was obtained prior to procedure with all questions answered and concerns addressed.  [place] Pink, MD

## 2015-09-10 NOTE — Progress Notes (Signed)
Subjective: Postpartum Day 1 Cesarean Delivery/preeclampsia Patient reports incisional pain and tolerating PO.  No PIH sx  Objective: Vital signs in last 24 hours: Temp:  [97.5 F (36.4 C)-98.2 F (36.8 C)] 98.2 F (36.8 C) (10/15 0600) Pulse Rate:  [76-94] 89 (10/15 0700) Resp:  [15-29] 18 (10/15 0600) BP: (119-197)/(59-115) 142/104 mmHg (10/15 0700) SpO2:  [91 %-98 %] 96 % (10/15 0600) Weight:  [103.692 kg (228 lb 9.6 oz)] 103.692 kg (228 lb 9.6 oz) (10/15 0115)  Physical Exam:  General: alert and cooperative Lochia: appropriate Uterine Fundus: firm Incision: C/D/I    Recent Labs  09/09/15 1620 09/10/15 0525  HGB 11.4* 10.8*  HCT 36.3 35.1*    Assessment/Plan: Status post Cesarean section. Stable on Magnesium and UOP starting to pick up. Will check labs this PM.  BP stable on magnesium and po labetalol Baby stable in NICU, just prob mild RDS  Kamden Reber W 09/10/2015, 8:21 AM

## 2015-09-10 NOTE — Addendum Note (Signed)
Addendum  created 09/10/15 0740 by Raenette Rover, CRNA   Modules edited: Notes Section   Notes Section:  File: [id number]

## 2015-09-10 NOTE — Progress Notes (Signed)
CRITICAL VALUE ALERT  Critical value received:  Magnesium 7.7  Date of notification:  09/10/15  Time of notification:  3419 Critical value read back: yes  Nurse who received alert: Luretha Rued [facility]   MD notified (1st page):  1740  Time of first page:  1740  MD notified (2nd page):  Time of second page:  Responding MD:  Dr. Marvel Plan  Time MD responded:  [phone]

## 2015-09-11 NOTE — Plan of Care (Signed)
Problem: Phase II Progression Outcomes Goal: Rh isoimmunization per orders Outcome: Not Applicable Date Met:  58/48/35 Mom A neg and Baby A neg

## 2015-09-11 NOTE — Progress Notes (Signed)
Mom to NICU to see baby.

## 2015-09-11 NOTE — Progress Notes (Signed)
Subjective: Postpartum Day 2 Cesarean Delivery Patient reports tolerating PO and no problems voiding.  Feels better off magnesium.  Objective: Vital signs in last 24 hours: Temp:  [97.9 F (36.6 C)-98.5 F (36.9 C)] 98.2 F (36.8 C) (10/16 0926) Pulse Rate:  [73-110] 82 (10/16 0926) Resp:  [18] 18 (10/16 0926) BP: (118-164)/(55-99) 159/82 mmHg (10/16 0926) SpO2:  [96 %-99 %] 98 % (10/15 1617)  Physical Exam:  General: alert and cooperative Lochia: appropriate Uterine Fundus: firm    Recent Labs  09/10/15 0525 09/10/15 1609  HGB 10.8* 9.8*  HCT 35.1* 31.0*    Assessment/Plan: Status post Cesarean section. Doing well postoperatively.  Magnesium d/c at around midnight last night (24 hours pp) and BP stable thus far on labetalol 100mg  po BID.  D/w Pt may have to increase that dose over course of day if BP rise.  Labs WNL last pm. Baby stable on O2 in NICU  Harsimran Westman W 09/11/2015, 9:44 AM

## 2015-09-12 ENCOUNTER — Encounter (HOSPITAL_COMMUNITY): Payer: Self-pay | Admitting: *Deleted

## 2015-09-12 MED ORDER — IBUPROFEN 600 MG PO TABS: 600.0000 mg | ORAL_TABLET | Freq: Four times a day (QID) | ORAL | Status: DC

## 2015-09-12 MED ORDER — OXYCODONE-ACETAMINOPHEN 5-325 MG PO TABS: 1.0000 | ORAL_TABLET | ORAL | Status: DC | PRN

## 2015-09-12 NOTE — Discharge Summary (Signed)
Obstetric Discharge Summary Reason for Admission: cesarean section Prenatal Procedures: NST, Preeclampsia and ultrasound Intrapartum Procedures: cesarean: low cervical, transverse Postpartum Procedures: magnesium Complications-Operative and Postpartum: none HEMOGLOBIN  Date Value Ref Range Status  09/10/2015 9.8* 12.0 - 15.0 g/dL Final   HCT  Date Value Ref Range Status  09/10/2015 31.0* 36.0 - 46.0 % Final    Physical Exam:  General: alert and cooperative Lochia: appropriate Uterine Fundus: firm Incision: C/D/I   Discharge Diagnoses: Placenta previa, Preelampsia and Preterm pregnancy delivered at 36+weeks  Discharge Information: Date: 09/12/2015 Activity: pelvic rest Diet: routine Medications: Ibuprofen, Percocet and labetalol Condition: improved Instructions: refer to practice specific booklet Discharge to: home Follow-up Information    Follow up with Logan Bores, MD In 3 days.   Specialty:  Obstetrics and Gynecology   Why:  blood pressure check   Contact information:   [address] [place] [address] [phone]       Newborn Data: Live born female  Birth Weight: 5 lb 15.2 oz (2700 g) APGAR: 4, 9  Stable in NICU  Nalayah Hitt W 09/12/2015, 8:38 AM

## 2015-09-12 NOTE — Progress Notes (Signed)
CSW acknowledges NICU admission.    Patient screened out for psychosocial assessment since none of the following apply:  Psychosocial stressors documented in mother or baby's chart  Gestation less than 32 weeks  Code at delivery   Critically ill infant  Infant with anomalies  Please contact the Clinical Social Worker if specific needs arise, or by MOB's request.       

## 2015-09-12 NOTE — Lactation Note (Signed)
This note was copied from the chart of Megan Webster. Lactation Consultation Note  Patient Name: Megan Webster [id number] Date: 09/12/2015 Reason for consult: Follow-up assessment;NICU baby  NICU baby 73 hours old. Parents state that they think they will be discharged later today. Mom states that she would like a 2-week DEBP rental. Parents given paperwork and enc to call for [facility] when ready for pump. Mom aware of NICU pumping rooms. Mom states that she is pumping every 2-3 hours, but has actually pumped once in over 12 hours. Discussed supply and demand and enc mom to pump 8 times/24 hours for 15 minutes. Enc mom to hand express after pumping. Enc mom to offer STS and nuzzling/latching to breast in NICU as she and baby able. Mom states that she isn't seeing any colostrum yet. Discussed normal progression of milk coming to volume and enc mom to continue pumping. Mom aware of OP/BFSG and [place] phone line assistance after D/C.  Maternal Data    Feeding Feeding Type: Formula Length of feed: 30 min  LATCH Score/Interventions                      Lactation Tools Discussed/Used     Consult Status Consult Status: PRN    Inocente Salles 09/12/2015, 12:11 PM

## 2015-09-12 NOTE — Progress Notes (Signed)
Discharge instructions given

## 2015-09-12 NOTE — Progress Notes (Signed)
Subjective: Postpartum Day 3: Cesarean Delivery Patient reports tolerating PO and no problems voiding.    Objective: Vital signs in last 24 hours: Temp:  [97.8 F (36.6 C)-98.3 F (36.8 C)] 98.3 F (36.8 C) (10/17 0826) Pulse Rate:  [82-99] 91 (10/17 0826) Resp:  [16-18] 16 (10/17 0826) BP: (133-164)/(79-98) 156/82 mmHg (10/17 0826) SpO2:  [98 %] 98 % (10/17 0826)  Physical Exam:  General: alert and cooperative Lochia: appropriate Uterine Fundus: firm Incision: C/D/I    Recent Labs  09/10/15 0525 09/10/15 1609  HGB 10.8* 9.8*  HCT 35.1* 31.0*    Assessment/Plan: Status post Cesarean section. Doing well postoperatively.  Discharge home with standard precautions and return to clinic in 3 days for BP check.  Logan Bores 09/12/2015, 8:37 AM

## 2015-09-13 LAB — TYPE AND SCREEN
ABO/RH(D): A NEG
Antibody Screen: NEGATIVE
Unit division: 0
Unit division: 0

## 2015-09-14 ENCOUNTER — Ambulatory Visit: Payer: Self-pay

## 2015-09-14 NOTE — Lactation Note (Signed)
This note was copied from the chart of Megan Webster. Lactation Consultation Note  Patient Name: Megan Webster Date: 09/14/2015 Reason for consult: Follow-up assessment;NICU baby  NICU baby 59 days old. Called to NICU to discuss pumping with mom. Mom states that she is pumping every 3 hours but hasn't seen any colostrum yet. However, mom hasn't pumped in almost 12 hours. Discussed with mom that there is no way to know for sure if someone will produce milk or not. Discussed with mom the need to pump at least 8 times/24 hours for 15 minutes followed by hand expression. Discussed power-pumping and STS and nuzzling/latching attempts at breast as well. Enc mom to bring her pumping kit and pump in NICU pumping rooms as well. Mom states that she wants to keep trying, but she is fine if she isn't able to produce milk.    Maternal Data    Feeding    LATCH Score/Interventions                      Lactation Tools Discussed/Used     Consult Status Consult Status: PRN    Inocente Salles 09/14/2015, 1:54 PM

## 2015-09-14 NOTE — Progress Notes (Signed)
Post discharge chart review completed.  

## 2015-09-16 ENCOUNTER — Ambulatory Visit: Payer: Self-pay

## 2015-09-16 NOTE — Lactation Note (Signed)
This note was copied from the chart of Megan Webster. Lactation Consultation Note  Patient Name: Megan Webster [id number] Date: 09/16/2015 Reason for consult: Follow-up assessment   [name], now 10 days old and 37 2/7 weeks CGA. Mom reports she has been pumping 5-7 times a day, but still has no milk. Mom has a history of PCOS, and on exam, has wide spaced breast, with very little glandular tissue. I was able to hand express a small drop of clear colostrum. I reviewed with mom the importance of pumping at least 8 times a day, followed by hand expression, staying hydrated, nuzzling with her baby, skin to skin, and gave her information on moringa and fenugreek. Mom knows to call for questions/concerns.    Maternal Data    Feeding Feeding Type: Formula Length of feed: 30 min  LATCH Score/Interventions Latch: Grasps breast easily, tongue down, lips flanged, rhythmical sucking.  Audible Swallowing: None Intervention(s): Skin to skin;Hand expression  Type of Nipple: Everted at rest and after stimulation  Comfort (Breast/Nipple): Soft / non-tender     Hold (Positioning): Assistance needed to correctly position infant at breast and maintain latch.  LATCH Score: 7  Lactation Tools Discussed/Used     Consult Status Consult Status: PRN Follow-up type: In-patient (NICU)    Tonna Corner 09/16/2015, 3:31 PM

## 2015-10-27 ENCOUNTER — Inpatient Hospital Stay (HOSPITAL_COMMUNITY): Admission: AD | Admit: 2015-10-27 | Payer: Self-pay | Source: Ambulatory Visit | Admitting: Obstetrics and Gynecology

## 2016-02-01 DIAGNOSIS — E039 Hypothyroidism, unspecified: Secondary | ICD-10-CM | POA: Insufficient documentation

## 2016-02-01 DIAGNOSIS — N915 Oligomenorrhea, unspecified: Secondary | ICD-10-CM | POA: Insufficient documentation

## 2019-01-25 DIAGNOSIS — E781 Pure hyperglyceridemia: Secondary | ICD-10-CM

## 2019-01-25 HISTORY — DX: Pure hyperglyceridemia: E78.1

## 2019-01-28 ENCOUNTER — Other Ambulatory Visit: Payer: Self-pay | Admitting: Family Medicine

## 2019-01-28 NOTE — Telephone Encounter (Signed)
Copied from [place] [phone]. Topic: Quick Communication - Rx Refill/Question >> Jan 28, 2019  3:52 PM Sheran Luz wrote: Medication: levothyroxine (SYNTHROID, LEVOTHROID) 100 MCG tablet   Patient is requesting a partial refill of this medication until NP appointment on 3/11.  Pharmacy: [id number] - [place], [place] - [address] [phone] (Phone) [phone] (Fax)

## 2019-01-28 NOTE — Telephone Encounter (Signed)
(  Per patient record during pregnancy- Dr Chalmers Cater had been taking care of patient's hypothyroid. Patient record indicates same dosage as she is requesting now.) Call to patient- left message for her to call back- because there is no current record of her treatment and she is new patient- PCP will probably not be able to fill Rx- she should contact prescribing physician to see if she can get extension of Rx.

## 2019-02-04 ENCOUNTER — Other Ambulatory Visit: Payer: Self-pay

## 2019-02-04 ENCOUNTER — Ambulatory Visit: Payer: BC Managed Care – PPO | Admitting: Family Medicine

## 2019-02-04 ENCOUNTER — Encounter: Payer: Self-pay | Admitting: Family Medicine

## 2019-02-04 VITALS — BP 126/82 | HR 63 | Temp 97.8°F | Resp 16 | Ht 67.0 in | Wt 216.6 lb

## 2019-02-04 DIAGNOSIS — E282 Polycystic ovarian syndrome: Secondary | ICD-10-CM | POA: Diagnosis not present

## 2019-02-04 DIAGNOSIS — I10 Essential (primary) hypertension: Secondary | ICD-10-CM

## 2019-02-04 DIAGNOSIS — E039 Hypothyroidism, unspecified: Secondary | ICD-10-CM | POA: Diagnosis not present

## 2019-02-04 NOTE — Progress Notes (Signed)
Office Note 02/04/2019  CC:  Chief Complaint  Patient presents with  . Establish Care    Previous PCP, Dr.Hodgson in [place] over 2 years ago    HPI:  Megan Webster is a 33 y.o. female who is here to establish care Patient's most recent primary MD: MD in [place], last saw him about 2 yrs ago. Old records were not available for review prior to or during today's visit.  Last pap 2016 while pregnant. Pt has appt with GYN Dr. Melba Webster next month.  No acute complaints.  Past Medical History:  Diagnosis Date  . Basal cell carcinoma    nose  . GERD (gastroesophageal reflux disease)    omeprazole otc 1 qod  . Hypertension    2016; when pregnant-->continued.  . Hypothyroidism 2014 approx  . Obesity, Class I, BMI 30-34.9   . PCOS (polycystic ovarian syndrome)    metformin leading up to pregnancy and then stopped it.    Past Surgical History:  Procedure Laterality Date  . CESAREAN SECTION N/A 09/09/2015   Procedure: CESAREAN SECTION;  Surgeon: Paula Compton, MD;  Location: [facility];  Service: Obstetrics;  Laterality: N/A;  . ENDOMETRIAL FULGURATION  2015  . WISDOM TOOTH EXTRACTION      Family History  Problem Relation Age of Onset  . High blood pressure Mother   . High arches Mother   . High blood pressure Father   . High Cholesterol Father   . Hearing loss Father   . Depression Sister   . Depression Paternal Grandmother   . Heart disease Paternal Grandmother   . Heart attack Paternal Grandfather     Social History   Socioeconomic History  . Marital status: Married    Spouse name: Not on file  . Number of children: Not on file  . Years of education: Not on file  . Highest education level: Not on file  Occupational History  . Not on file  Social Needs  . Financial resource strain: Not on file  . Food insecurity:    Worry: Not on file    Inability: Not on file  . Transportation needs:    Medical: Not on file    Non-medical: Not on file  Tobacco Use  .  Smoking status: Never Smoker  . Smokeless tobacco: Never Used  Substance and Sexual Activity  . Alcohol use: No  . Drug use: No  . Sexual activity: Yes    Birth control/protection: None  Lifestyle  . Physical activity:    Days per week: Not on file    Minutes per session: Not on file  . Stress: Not on file  Relationships  . Social connections:    Talks on phone: Not on file    Gets together: Not on file    Attends religious service: Not on file    Active member of club or organization: Not on file    Attends meetings of clubs or organizations: Not on file    Relationship status: Not on file  . Intimate partner violence:    Fear of current or ex partner: Not on file    Emotionally abused: Not on file    Physically abused: Not on file    Forced sexual activity: Not on file  Other Topics Concern  . Not on file  Social History Narrative   Married, 1 son.   Orig from [facility], went to [place] for a while, relocated to [place] again 2018.   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   No T/A/Ds.    Outpatient Encounter Medications as of 02/04/2019  Medication Sig  . labetalol (NORMODYNE) 100 MG tablet Take 100 mg by mouth 2 (two) times daily.  Marland Kitchen levothyroxine (SYNTHROID, LEVOTHROID) 100 MCG tablet Take 100 mcg by mouth daily before breakfast.  . omeprazole (PRILOSEC) 20 MG capsule Take 20 mg by mouth daily.  . Prenatal Vit-Fe Fumarate-FA (PRENATAL MULTIVITAMIN) TABS tablet Take 1 tablet by mouth daily at 12 noon.  . [DISCONTINUED] ibuprofen (ADVIL,MOTRIN) 600 MG tablet Take 1 tablet (600 mg total) by mouth every 6 (six) hours. (Patient not taking: Reported on 02/04/2019)  . [DISCONTINUED] oxyCODONE-acetaminophen (PERCOCET/ROXICET) 5-325 MG tablet Take 1 tablet by mouth every 4 (four) hours as needed (for pain scale 4-7). (Patient not taking: Reported on 02/04/2019)   No facility-administered encounter medications on file as of 02/04/2019.      No Known Allergies  ROS Review of Systems  Constitutional: Negative for appetite change, chills, fatigue and fever.  HENT: Negative for congestion, dental problem, ear pain and sore throat.   Eyes: Negative for discharge, redness and visual disturbance.  Respiratory: Negative for cough, chest tightness, shortness of breath and wheezing.   Cardiovascular: Negative for chest pain, palpitations and leg swelling.  Gastrointestinal: Negative for abdominal pain, blood in stool, diarrhea, nausea and vomiting.  Genitourinary: Negative for difficulty urinating, dysuria, flank pain, frequency, hematuria and urgency.  Musculoskeletal: Negative for arthralgias, back pain, joint swelling, myalgias and neck stiffness.  Skin: Negative for pallor and rash.  Neurological: Negative for dizziness, speech difficulty, weakness and headaches.  Hematological: Negative for adenopathy. Does not bruise/bleed easily.  Psychiatric/Behavioral: Negative for confusion and sleep disturbance. The patient is not nervous/anxious.     PE; Blood pressure 126/82, pulse 63, temperature 97.8 F (36.6 C), temperature source Oral, resp. rate 16, height 5\' 7"  (1.702 m), weight 216 lb 9.6 oz (98.2 kg), SpO2 96 %, unknown if currently breastfeeding. Body mass index is 33.92 kg/m.  Gen: Alert, well appearing.  Patient is oriented to person, place, time, and situation. AFFECT: pleasant, lucid thought and speech. XNT:ZGYF: no injection, icteris, swelling, or exudate.  EOMI, PERRLA. Mouth: lips without lesion/swelling.  Oral mucosa pink and moist. Oropharynx without erythema, exudate, or swelling.  Neck - No masses or thyromegaly or limitation in range of motion CV: RRR, no m/r/g.   ABD: soft, NT/ND LUNGS: CTA bilat, nonlabored resps, good aeration in all lung fields. EXT: no clubbing or cyanosis.  no edema.  Skin - no sores or suspicious lesions or rashes or color changes Neuro: CN 2-12 intact bilaterally, strength 5/5 in  proximal and distal upper extremities and lower extremities bilaterally.  No sensory deficits.  No tremor.  No disdiadochokinesis.  No ataxia.  Upper extremity and lower extremity DTRs symmetric.  No pronator drift.  Pertinent labs:  No results found for: TSH Lab Results  Component Value Date   WBC 14.2 (H) 09/10/2015   HGB 9.8 (L) 09/10/2015   HCT 31.0 (L) 09/10/2015   MCV 83.3 09/10/2015   PLT 188 09/10/2015   Lab Results  Component Value Date   CREATININE 0.97 09/10/2015   BUN 13 09/10/2015   NA 135 09/10/2015   K 4.0 09/10/2015   CL 107 09/10/2015   CO2 25 09/10/2015   Lab Results  Component Value Date   ALT 19 09/10/2015   AST 25 09/10/2015   ALKPHOS 112 09/10/2015   BILITOT 0.3 09/10/2015  ASSESSMENT AND PLAN:   New pt: no prior PCP records to obtain.  1) HTN: The current medical regimen is effective;  continue present plan and medications. Lytes/cr today. Hepatic panel today as well, with lipid screen and glucose screen (she had a small peppermint patty 3 hours ago).  2) Hypothyroidism: TSH check today.  3) PCOS: followed by GYN, Dr. Melba Webster.  Has f/u next month for f/u of this problem and get pelvic/pap. CBC +see other labs above.   An After Visit Summary was printed and given to the patient.  Return in about 1 year (around 02/04/2020) for annual CPE (fasting).  Signed:  Crissie Sickles, MD           02/04/2019

## 2019-02-05 LAB — COMPREHENSIVE METABOLIC PANEL
ALT: 49 U/L — ABNORMAL HIGH (ref 0–35)
AST: 35 U/L (ref 0–37)
Albumin: 4.5 g/dL (ref 3.5–5.2)
Alkaline Phosphatase: 103 U/L (ref 39–117)
BUN: 16 mg/dL (ref 6–23)
CO2: 26 meq/L (ref 19–32)
Calcium: 11.3 mg/dL — ABNORMAL HIGH (ref 8.4–10.5)
Chloride: 106 mEq/L (ref 96–112)
Creatinine, Ser: 0.92 mg/dL (ref 0.40–1.20)
GFR: 69.94 mL/min (ref >60.00)
GLUCOSE: 74 mg/dL (ref 70–99)
POTASSIUM: 4.2 meq/L (ref 3.5–5.1)
Sodium: 141 mEq/L (ref 135–145)
Total Bilirubin: 0.4 mg/dL (ref 0.2–1.2)
Total Protein: 7.2 g/dL (ref 6.0–8.3)

## 2019-02-05 LAB — CBC
HEMATOCRIT: 41.2 % (ref 36.0–46.0)
HEMOGLOBIN: 13.6 g/dL (ref 12.0–15.0)
MCHC: 33 g/dL (ref 30.0–36.0)
MCV: 84.7 fl (ref 78.0–100.0)
PLATELETS: 209 10*3/uL (ref 150.0–400.0)
RBC: 4.87 Mil/uL (ref 3.87–5.11)
RDW: 16 % — ABNORMAL HIGH (ref 11.5–15.5)
WBC: 8.7 10*3/uL (ref 4.0–10.5)

## 2019-02-05 LAB — LDL CHOLESTEROL, DIRECT: Direct LDL: 115 mg/dL

## 2019-02-05 LAB — LIPID PANEL
CHOL/HDL RATIO: 6
CHOLESTEROL: 228 mg/dL — AB (ref 0–200)
HDL: 35.2 mg/dL — AB (ref >39.00)
Triglycerides: 452 mg/dL — ABNORMAL HIGH (ref 0.0–149.0)

## 2019-02-05 LAB — TSH: TSH: 1.1 u[IU]/mL (ref 0.35–4.50)

## 2019-02-06 ENCOUNTER — Encounter: Payer: Self-pay | Admitting: Family Medicine

## 2019-02-09 ENCOUNTER — Telehealth: Payer: Self-pay | Admitting: Family Medicine

## 2019-02-09 ENCOUNTER — Other Ambulatory Visit: Payer: Self-pay | Admitting: *Deleted

## 2019-02-09 DIAGNOSIS — E781 Pure hyperglyceridemia: Secondary | ICD-10-CM

## 2019-02-09 NOTE — Telephone Encounter (Signed)
Copied from [place] [phone]. Topic: Quick Communication - Lab Results (Clinic Use ONLY) >> Feb 09, 2019  9:39 AM Onalee Hua, CMA wrote: See lab result note dated 02/04/19. Result note has been sent to [facility] NR Triage.  Okay for PEC to discuss results/PCP recommendations.  >> Feb 09, 2019  3:52 PM Wynetta Emery, [place] C wrote: Pt is returning call for results. Pt would like a call back at: [phone] (pt's job, please just ask to speak with her)

## 2019-02-09 NOTE — Telephone Encounter (Signed)
Lab message given to patient regarding labs. She also wanted her pcp to know that she needs a refill on levothyroxine 100 mcg. And also she stated that she was looking at her MyChart and discovered that the labetalol dosing is not correct. It states that she takes 100 mg bid but she is taking 200 mg bid.

## 2019-02-10 ENCOUNTER — Encounter: Payer: Self-pay | Admitting: Family Medicine

## 2019-02-10 ENCOUNTER — Other Ambulatory Visit: Payer: Self-pay | Admitting: *Deleted

## 2019-02-10 MED ORDER — GEMFIBROZIL 600 MG PO TABS: 600.0000 mg | ORAL_TABLET | Freq: Two times a day (BID) | ORAL | 2 refills | Status: DC

## 2019-02-10 MED ORDER — LABETALOL HCL 200 MG PO TABS: 200.0000 mg | ORAL_TABLET | Freq: Two times a day (BID) | ORAL | 0 refills | Status: DC

## 2019-02-10 MED ORDER — LEVOTHYROXINE SODIUM 100 MCG PO TABS: 100.0000 ug | ORAL_TABLET | Freq: Every day | ORAL | 1 refills | Status: DC

## 2019-05-09 ENCOUNTER — Other Ambulatory Visit: Payer: Self-pay | Admitting: Family Medicine

## 2019-05-11 NOTE — Telephone Encounter (Signed)
LMOM for patient to CB to schedule lab appt only before refilling medication as requested in last Lab result note.

## 2019-05-11 NOTE — Telephone Encounter (Signed)
Lab appt scheduled 05/21/2019

## 2019-05-21 ENCOUNTER — Ambulatory Visit (INDEPENDENT_AMBULATORY_CARE_PROVIDER_SITE_OTHER): Payer: BC Managed Care – PPO | Admitting: Family Medicine

## 2019-05-21 ENCOUNTER — Encounter: Payer: Self-pay | Admitting: Family Medicine

## 2019-05-21 ENCOUNTER — Other Ambulatory Visit: Payer: Self-pay

## 2019-05-21 DIAGNOSIS — E781 Pure hyperglyceridemia: Secondary | ICD-10-CM | POA: Diagnosis not present

## 2019-05-21 LAB — LIPID PANEL
Cholesterol: 204 mg/dL — ABNORMAL HIGH (ref 0–200)
HDL: 34 mg/dL — ABNORMAL LOW (ref >39.00)
NonHDL: 169.79
Total CHOL/HDL Ratio: 6
Triglycerides: 207 mg/dL — ABNORMAL HIGH (ref 0.0–149.0)
VLDL: 41.4 mg/dL — ABNORMAL HIGH (ref 0.0–40.0)

## 2019-05-21 LAB — LDL CHOLESTEROL, DIRECT: Direct LDL: 128 mg/dL

## 2019-05-21 NOTE — Telephone Encounter (Signed)
Patient had lab appt this morning, but also took her last gemfibrozil today.  Please send in RX as soon as labs come back?

## 2019-05-24 ENCOUNTER — Encounter: Payer: Self-pay | Admitting: Family Medicine

## 2019-05-24 ENCOUNTER — Other Ambulatory Visit: Payer: Self-pay | Admitting: Family Medicine

## 2019-05-24 NOTE — Progress Notes (Signed)
A user error has taken place: encounter opened in error, closed for administrative reasons.

## 2019-05-27 DIAGNOSIS — U071 COVID-19: Secondary | ICD-10-CM

## 2019-05-27 HISTORY — DX: COVID-19: U07.1

## 2019-06-09 ENCOUNTER — Encounter: Payer: Self-pay | Admitting: Family Medicine

## 2019-06-22 ENCOUNTER — Encounter (HOSPITAL_COMMUNITY): Payer: Self-pay | Admitting: Emergency Medicine

## 2019-06-22 ENCOUNTER — Emergency Department (HOSPITAL_COMMUNITY): Payer: BC Managed Care – PPO

## 2019-06-22 ENCOUNTER — Emergency Department (HOSPITAL_COMMUNITY)
Admission: EM | Admit: 2019-06-22 | Discharge: 2019-06-22 | Disposition: A | Discharge status: Home / Self Care | Payer: BC Managed Care – PPO | Attending: Emergency Medicine | Admitting: Emergency Medicine

## 2019-06-22 DIAGNOSIS — U071 COVID-19: Secondary | ICD-10-CM | POA: Insufficient documentation

## 2019-06-22 DIAGNOSIS — E875 Hyperkalemia: Secondary | ICD-10-CM | POA: Diagnosis not present

## 2019-06-22 DIAGNOSIS — I10 Essential (primary) hypertension: Secondary | ICD-10-CM | POA: Diagnosis not present

## 2019-06-22 DIAGNOSIS — R1013 Epigastric pain: Secondary | ICD-10-CM | POA: Diagnosis present

## 2019-06-22 DIAGNOSIS — Z79899 Other long term (current) drug therapy: Secondary | ICD-10-CM | POA: Insufficient documentation

## 2019-06-22 DIAGNOSIS — R918 Other nonspecific abnormal finding of lung field: Secondary | ICD-10-CM

## 2019-06-22 LAB — CBC WITH DIFFERENTIAL/PLATELET
Abs Immature Granulocytes: 0.12 10*3/uL — ABNORMAL HIGH (ref 0.00–0.07)
Basophils Absolute: 0 10*3/uL (ref 0.0–0.1)
Basophils Relative: 0 %
Eosinophils Absolute: 0.1 10*3/uL (ref 0.0–0.5)
Eosinophils Relative: 1 %
HCT: 51.1 % — ABNORMAL HIGH (ref 36.0–46.0)
Hemoglobin: 16 g/dL — ABNORMAL HIGH (ref 12.0–15.0)
Immature Granulocytes: 1 %
Lymphocytes Relative: 13 %
Lymphs Abs: 2.1 10*3/uL (ref 0.7–4.0)
MCH: 27.9 pg (ref 26.0–34.0)
MCHC: 31.3 g/dL (ref 30.0–36.0)
MCV: 89.2 fL (ref 80.0–100.0)
Monocytes Absolute: 0.9 10*3/uL (ref 0.1–1.0)
Monocytes Relative: 5 %
Neutro Abs: 13.5 10*3/uL — ABNORMAL HIGH (ref 1.7–7.7)
Neutrophils Relative %: 80 %
Platelets: 298 10*3/uL (ref 150–400)
RBC: 5.73 MIL/uL — ABNORMAL HIGH (ref 3.87–5.11)
RDW: 14.5 % (ref 11.5–15.5)
WBC: 16.8 10*3/uL — ABNORMAL HIGH (ref 4.0–10.5)
nRBC: 0 % (ref 0.0–0.2)

## 2019-06-22 LAB — I-STAT BETA HCG BLOOD, ED (MC, WL, AP ONLY): I-stat hCG, quantitative: <5 m[IU]/mL (ref <5)

## 2019-06-22 LAB — COMPREHENSIVE METABOLIC PANEL
ALT: 31 U/L (ref 0–44)
AST: 27 U/L (ref 15–41)
Albumin: 4.4 g/dL (ref 3.5–5.0)
Alkaline Phosphatase: 95 U/L (ref 38–126)
Anion gap: 10 (ref 5–15)
BUN: 19 mg/dL (ref 6–20)
CO2: 24 mmol/L (ref 22–32)
Calcium: 11.3 mg/dL — ABNORMAL HIGH (ref 8.9–10.3)
Chloride: 106 mmol/L (ref 98–111)
Creatinine, Ser: 1.01 mg/dL — ABNORMAL HIGH (ref 0.44–1.00)
GFR calc Af Amer: >60 mL/min (ref >60)
GFR calc non Af Amer: >60 mL/min (ref >60)
Glucose, Bld: 100 mg/dL — ABNORMAL HIGH (ref 70–99)
Potassium: 6.2 mmol/L — ABNORMAL HIGH (ref 3.5–5.1)
Sodium: 140 mmol/L (ref 135–145)
Total Bilirubin: 0.6 mg/dL (ref 0.3–1.2)
Total Protein: 8 g/dL (ref 6.5–8.1)

## 2019-06-22 LAB — LIPASE, BLOOD: Lipase: 30 U/L (ref 11–51)

## 2019-06-22 LAB — POTASSIUM: Potassium: 5.4 mmol/L — ABNORMAL HIGH (ref 3.5–5.1)

## 2019-06-22 MED ORDER — SODIUM CHLORIDE 0.9 % IV BOLUS
1000.0000 mL | Freq: Once | INTRAVENOUS | Status: AC
  Administered 2019-06-22: 1000 mL via INTRAVENOUS

## 2019-06-22 MED ORDER — IOHEXOL 300 MG/ML  SOLN
100.0000 mL | Freq: Once | INTRAMUSCULAR | Status: AC | PRN
  Administered 2019-06-22: 100 mL via INTRAVENOUS

## 2019-06-22 MED ORDER — FAMOTIDINE 20 MG PO TABS
20.0000 mg | ORAL_TABLET | Freq: Once | ORAL | Status: AC
  Administered 2019-06-22: 20 mg via ORAL
  Filled 2019-06-22: qty 1

## 2019-06-22 NOTE — ED Triage Notes (Signed)
Pt here from home with abd pain and nausea , no vomiting , pt also has a rash to both legs and arms , on e of co workers tested positive for Covid pt has been swabbed waiting on results

## 2019-06-22 NOTE — Discharge Instructions (Signed)
Please read attached information.  As we discussed your potassium is elevated here today.  I would recommend that you had this rechecked in 2 days.  If your primary care is unable to recheck it you may follow-up at urgent care or return to the emergency room for repeat lab testing.

## 2019-06-22 NOTE — ED Notes (Signed)
Levada Dy, RN given purse to give to pt. RN gave purse to pt.

## 2019-06-22 NOTE — ED Provider Notes (Signed)
[facility]ERGENCY DEPARTMENT Provider Note   CSN: [id number] Arrival date & time: 06/22/19  1222   History   Chief Complaint Chief Complaint  Patient presents with   Abdominal Pain   Rash   HPI Megan Webster is a 33 y.o. female.      HPI   73 YOF female presents today with complaints of abdominal pain.  Patient notes that last night she felt a burning sensation in her epigastric abdomen.  She notes this was very severe.  She notes this is different from her normal reflux.  She notes that this went away but then again came back in the middle of the night.  She notes this was not close to the time she ate as it was hours later.  She denied any lower abdominal pain nausea vomiting or fever.  She was able to tolerate ginger ale last night.  She notes the symptoms went away but then again came back around 10 AM this morning.  At the time my evaluation she reports she is asymptomatic.  She reports she is not pregnant or breast-feeding.  She does not take aspirin, Goody powders, BC, ibuprofen, or drink alcohol.  She notes that on the way to the hospital after getting the ambulance she developed a rash to her forearms and bilateral knees, she notes the rash is improving presently.  She notes that 2 coworkers recently tested positive for COVID-19, she denies any cough shortness of breath or fever.   Past Medical History:  Diagnosis Date   Basal cell carcinoma    nose   GERD (gastroesophageal reflux disease)    omeprazole otc 1 qod   Hypertension    2016; when pregnant-->continued.   Hypertriglyceridemia 01/2019   gemfibrozil->pt repsonded well.   Hypothyroidism 2014 approx   Obesity, Class I, BMI 30-34.9    PCOS (polycystic ovarian syndrome)    metformin leading up to pregnancy and then stopped it.    Patient Active Problem List   Diagnosis Date Noted   Placenta previa 09/10/2015    Past Surgical History:  Procedure Laterality Date   CESAREAN  SECTION N/A 09/09/2015   Procedure: CESAREAN SECTION;  Surgeon: Paula Compton, MD;  Location: [facility];  Service: Obstetrics;  Laterality: N/A;   ENDOMETRIAL FULGURATION  2015   WISDOM TOOTH EXTRACTION       OB History    Gravida  2   Para  1   Term      Preterm  1   AB  1   Living  1     SAB  1   TAB      Ectopic      Multiple  0   Live Births  1            Home Medications    Prior to Admission medications   Medication Sig Start Date End Date Taking? Authorizing Provider  gemfibrozil (LOPID) 600 MG tablet TAKE 1 TABLET (600 MG TOTAL) BY MOUTH 2 (TWO) TIMES DAILY BEFORE A MEAL. 05/24/19  Yes McGowen, Adrian Blackwater, MD  labetalol (NORMODYNE) 200 MG tablet Take 1 tablet (200 mg total) by mouth 2 (two) times daily. 02/10/19  Yes McGowen, Adrian Blackwater, MD  levothyroxine (SYNTHROID, LEVOTHROID) 100 MCG tablet Take 1 tablet (100 mcg total) by mouth daily before breakfast. 02/10/19  Yes McGowen, Adrian Blackwater, MD    Family History Family History  Problem Relation Age of Onset   High blood pressure Mother  High arches Mother    High blood pressure Father    High Cholesterol Father    Hearing loss Father    Depression Sister    Depression Paternal Grandmother    Heart disease Paternal Grandmother    Heart attack Paternal Grandfather     Social History Social History   Tobacco Use   Smoking status: Never Smoker   Smokeless tobacco: Never Used  Substance Use Topics   Alcohol use: No   Drug use: No     Allergies   Patient has no known allergies.  Review of Systems Review of Systems  All other systems reviewed and are negative.   Physical Exam Updated Vital Signs BP 119/84    Pulse 77    Temp 97.8 F (36.6 C) (Oral)    Resp 17    LMP 06/14/2019 (Approximate)    SpO2 97%   Physical Exam Vitals signs and nursing note reviewed.  Constitutional:      Appearance: She is well-developed.  HENT:     Head: Normocephalic and atraumatic.  Eyes:      General: No scleral icterus.       Right eye: No discharge.        Left eye: No discharge.     Conjunctiva/sclera: Conjunctivae normal.     Pupils: Pupils are equal, round, and reactive to light.  Neck:     Musculoskeletal: Normal range of motion.     Vascular: No JVD.     Trachea: No tracheal deviation.  Pulmonary:     Effort: Pulmonary effort is normal. No respiratory distress.     Breath sounds: Normal breath sounds. No stridor. No wheezing or rales.  Abdominal:     General: Abdomen is flat. There is no distension.     Palpations: Abdomen is soft.     Tenderness: There is no abdominal tenderness. There is no guarding.  Skin:    Comments: Faint blanchable fine erythematous rash to the bilateral knees-no surrounding swelling  Neurological:     Mental Status: She is alert and oriented to person, place, and time.     Coordination: Coordination normal.  Psychiatric:        Behavior: Behavior normal.        Thought Content: Thought content normal.        Judgment: Judgment normal.     ED Treatments / Results  Labs (all labs ordered are listed, but only abnormal results are displayed) Labs Reviewed  CBC WITH DIFFERENTIAL/PLATELET - Abnormal; Notable for the following components:      Result Value   WBC 16.8 (*)    RBC 5.73 (*)    Hemoglobin 16.0 (*)    HCT 51.1 (*)    Neutro Abs 13.5 (*)    Abs Immature Granulocytes 0.12 (*)    All other components within normal limits  COMPREHENSIVE METABOLIC PANEL - Abnormal; Notable for the following components:   Potassium 6.2 (*)    Glucose, Bld 100 (*)    Creatinine, Ser 1.01 (*)    Calcium 11.3 (*)    All other components within normal limits  POTASSIUM - Abnormal; Notable for the following components:   Potassium 5.4 (*)    All other components within normal limits  SARS CORONAVIRUS 2 (HOSPITAL ORDER, [facility])  LIPASE, BLOOD  I-STAT BETA HCG BLOOD, ED ([facility], [facility], [facility] ONLY)     EKG None  Radiology Ct Abdomen Pelvis W Contrast  Result Date: 06/22/2019 CLINICAL  DATA:  Abdominal pain with nausea. Rash involving the arms and legs. EXAM: CT ABDOMEN AND PELVIS WITH CONTRAST TECHNIQUE: Multidetector CT imaging of the abdomen and pelvis was performed using the standard protocol following bolus administration of intravenous contrast. CONTRAST:  148mL OMNIPAQUE IOHEXOL 300 MG/ML  SOLN COMPARISON:  None. FINDINGS: Lower chest: Patchy ground-glass opacities are present at both lung bases, likely inflammatory. There is no consolidation, pleural or pericardial effusion. Hepatobiliary: The liver is normal in density without suspicious focal abnormality. No evidence of gallstones, gallbladder wall thickening or biliary dilatation. Pancreas: Unremarkable. No pancreatic ductal dilatation or surrounding inflammatory changes. Spleen: Normal in size without focal abnormality. Adrenals/Urinary Tract: There are punctate calcifications within the right adrenal gland. The left adrenal gland appears normal. The kidneys appear normal without evidence of urinary tract calculus, suspicious lesion or hydronephrosis. No bladder abnormalities are seen. Stomach/Bowel: No evidence of bowel wall thickening, distention or surrounding inflammatory change. The appendix appears normal. Vascular/Lymphatic: There are no enlarged abdominal or pelvic lymph nodes. No significant vascular findings. Reproductive: Low-density areas in the cervix likely represent nabothian cysts. The uterus otherwise appears normal. No adnexal mass. Other: No evidence of abdominal wall mass or hernia. No ascites. Musculoskeletal: No acute or significant osseous findings. IMPRESSION: 1. No acute abdominal findings or explanation for the patient's symptoms. 2. Linear calcifications in the right adrenal gland, likely due to prior hemorrhage or inflammation. 3. Patchy ground-glass opacities at both lung bases, nonspecific, although likely  inflammatory. Consider viral pneumonia and testing for COVID-19 infection. These results were called by telephone at the time of interpretation on 06/22/2019 at 7:17 pm to Dr. Okey Regal , who verbally acknowledged these results. Electronically Signed   By: Richardean Sale M.D.   On: 06/22/2019 19:18    Procedures Procedures (including critical care time)  Medications Ordered in ED Medications  famotidine (PEPCID) tablet 20 mg (20 mg Oral Given 06/22/19 1446)  sodium chloride 0.9 % bolus 1,000 mL (0 mLs Intravenous Stopped 06/22/19 1912)  iohexol (OMNIPAQUE) 300 MG/ML solution 100 mL (100 mLs Intravenous Contrast Given 06/22/19 1842)     Initial Impression / Assessment and Plan / ED Course  I have reviewed the triage vital signs and the nursing notes.  Pertinent labs & imaging results that were available during my care of the patient were reviewed by me and considered in my medical decision making (see chart for details).        Labs: SARS coronavirus 2, potassium, i-STAT beta-hCG, CBC, CMP, lipase  Imaging: CT abdomen pelvis with contrast  Consults:  Therapeutics: Normal saline  Discharge Meds:   Assessment/Plan: 33 year old female presents today with complaints of abdominal pain.  She had a odd presentation with severe epigastric abdominal pain that has been coming and going.  This is unlike any pain she has had previously.  Her labs were concerning for a slight elevation in her white count and hyperkalemia.  Her CT scan concerning for groundglass opacities, given her recent exposure to COVID this is high on my differential.  Testing ordered at this time.  Her potassium did go down with repeat laboratory testing, uncertain etiology at this time.  She has no history renal failure, no other significant derangements, no signs of kidney failure.  She is on labetalol, I encouraged her to discuss this with her primary care provider and have repeat testing in 2 days.  She will follow-up  in urgent care or the emergency room if she is unable to have her potassium  rechecked as an outpatient.  She will return immediately if she develops any new or worsening signs or symptoms.  She verbalized understanding and agreement to today's plan had no further questions or concerns at the time of discharge.  Case was discussed with attending physician who agreed to assessment and plan.  Final Clinical Impressions(s) / ED Diagnoses   Final diagnoses:  Epigastric pain  Ground glass opacity present on imaging of lung  Hyperkalemia    ED Discharge Orders    None       Francee Gentile 06/22/19 2012    Dorie Rank, MD 06/26/19 [phone]

## 2019-06-23 LAB — SARS CORONAVIRUS 2 BY RT PCR (HOSPITAL ORDER, PERFORMED IN ~~LOC~~ HOSPITAL LAB): SARS Coronavirus 2: POSITIVE — AB

## 2019-07-06 ENCOUNTER — Telehealth: Payer: Self-pay

## 2019-07-06 NOTE — Telephone Encounter (Signed)
Received voicemail that patient would like to schedule follow up visit with PCP from recent ED visit on 06/22/19. She did test positive for COVID so she waited extra 2 weeks to schedule. Her potassium level was high and ED MD mentioned it may have something to do with medication, labetalol but reason for elevation unclear. It was encouraged that she have labs rechecked and discuss with PCP.   Please schedule patient for follow up with PCP.

## 2019-07-07 NOTE — Telephone Encounter (Signed)
Patient scheduled for 07/28/19

## 2019-07-28 ENCOUNTER — Other Ambulatory Visit: Payer: Self-pay

## 2019-07-28 ENCOUNTER — Encounter: Payer: Self-pay | Admitting: Family Medicine

## 2019-07-28 ENCOUNTER — Ambulatory Visit: Payer: BC Managed Care – PPO | Admitting: Family Medicine

## 2019-07-28 VITALS — BP 115/75 | HR 53 | Temp 97.5°F | Resp 16 | Ht 67.0 in | Wt 212.6 lb

## 2019-07-28 DIAGNOSIS — Z8619 Personal history of other infectious and parasitic diseases: Secondary | ICD-10-CM | POA: Diagnosis not present

## 2019-07-28 DIAGNOSIS — R1013 Epigastric pain: Secondary | ICD-10-CM

## 2019-07-28 DIAGNOSIS — D72829 Elevated white blood cell count, unspecified: Secondary | ICD-10-CM

## 2019-07-28 DIAGNOSIS — E875 Hyperkalemia: Secondary | ICD-10-CM

## 2019-07-28 DIAGNOSIS — Z8616 Personal history of COVID-19: Secondary | ICD-10-CM

## 2019-07-28 LAB — BASIC METABOLIC PANEL
BUN: 16 mg/dL (ref 6–23)
CO2: 22 mEq/L (ref 19–32)
Calcium: 10.9 mg/dL — ABNORMAL HIGH (ref 8.4–10.5)
Chloride: 107 mEq/L (ref 96–112)
Creatinine, Ser: 0.85 mg/dL (ref 0.40–1.20)
GFR: 76.41 mL/min (ref >60.00)
Glucose, Bld: 83 mg/dL (ref 70–99)
Potassium: 4.8 mEq/L (ref 3.5–5.1)
Sodium: 140 mEq/L (ref 135–145)

## 2019-07-28 LAB — CBC WITH DIFFERENTIAL/PLATELET
Basophils Absolute: 0.1 10*3/uL (ref 0.0–0.1)
Basophils Relative: 0.8 % (ref 0.0–3.0)
Eosinophils Absolute: 0.1 10*3/uL (ref 0.0–0.7)
Eosinophils Relative: 2.1 % (ref 0.0–5.0)
HCT: 42.5 % (ref 36.0–46.0)
Hemoglobin: 13.3 g/dL (ref 12.0–15.0)
Lymphocytes Relative: 38.6 % (ref 12.0–46.0)
Lymphs Abs: 2.4 10*3/uL (ref 0.7–4.0)
MCHC: 31.3 g/dL (ref 30.0–36.0)
MCV: 90.3 fl (ref 78.0–100.0)
Monocytes Absolute: 0.4 10*3/uL (ref 0.1–1.0)
Monocytes Relative: 7 % (ref 3.0–12.0)
Neutro Abs: 3.2 10*3/uL (ref 1.4–7.7)
Neutrophils Relative %: 51.5 % (ref 43.0–77.0)
Platelets: 254 10*3/uL (ref 150.0–400.0)
RBC: 4.71 Mil/uL (ref 3.87–5.11)
RDW: 16.5 % — ABNORMAL HIGH (ref 11.5–15.5)
WBC: 6.3 10*3/uL (ref 4.0–10.5)

## 2019-07-28 LAB — PHOSPHORUS: Phosphorus: 2.9 mg/dL (ref 2.3–4.6)

## 2019-07-28 NOTE — Progress Notes (Signed)
OFFICE VISIT  07/28/2019   CC:  Chief Complaint  Patient presents with  . Hospitalization Follow-up    elevated potassium level     HPI:    Patient is a 33 y.o. Caucasian female who presents for f/u of an ED visit from the end of July this year. She presented to ED for epigastric pain, had been exposed to several people at work with covid 19. She had no fever or resp sx's.  No n/v/d.  Her covid test was POSITIVE in the ED and her K+ was 6.2 initially, then recheck in ED showed it was 5.4.  CT scan of abd/pelv showed ground glass opacities. She has not had her potassium rechecked.  She had not been having any n/v/d.   WBC was 16K, Hb 16, plts normal.  Interim hx: After ED visit she had no further abd pain.  She never developed any fever or resp sx's. No HAs, myalgias or arthralgias, no fatigue, no loss of sense of taste or smell. She quarantined for 2 wks.  Currently feeling well. Reports that she was told at a CPE years ago that her potassium was high and she was given some dietary recommendations.  She does not take any pot supplements or eat high pot foods regularly.  No FH of elev potassium  ROS: no CP, no SOB, no wheezing, no cough, no dizziness, no HAs, no rashes, no melena/hematochezia.  No polyuria or polydipsia.  No myalgias or arthralgias.  Notes a couple months of right upper eyelid focal swelling, was initially a little red and sore, but this has now become a small asymptomatic bump.  No drainage.  Past Medical History:  Diagnosis Date  . Basal cell carcinoma    nose  . GERD (gastroesophageal reflux disease)    omeprazole otc 1 qod  . Hypertension    2016; when pregnant-->continued.  . Hypertriglyceridemia 01/2019   gemfibrozil->pt repsonded well.  . Hypothyroidism 2014 approx  . Obesity, Class I, BMI 30-34.9   . PCOS (polycystic ovarian syndrome)    metformin leading up to pregnancy and then stopped it.    Past Surgical History:  Procedure Laterality Date   . CESAREAN SECTION N/A 09/09/2015   Procedure: CESAREAN SECTION;  Surgeon: Paula Compton, MD;  Location: [facility];  Service: Obstetrics;  Laterality: N/A;  . ENDOMETRIAL FULGURATION  2015  . WISDOM TOOTH EXTRACTION      Outpatient Medications Prior to Visit  Medication Sig Dispense Refill  . gemfibrozil (LOPID) 600 MG tablet TAKE 1 TABLET (600 MG TOTAL) BY MOUTH 2 (TWO) TIMES DAILY BEFORE A MEAL. 180 tablet 3  . labetalol (NORMODYNE) 200 MG tablet Take 1 tablet (200 mg total) by mouth 2 (two) times daily. 1 tablet 0  . levothyroxine (SYNTHROID, LEVOTHROID) 100 MCG tablet Take 1 tablet (100 mcg total) by mouth daily before breakfast. 90 tablet 1   No facility-administered medications prior to visit.     No Known Allergies  ROS As per HPI  PE: Blood pressure 115/75, pulse (!) 53, temperature (!) 97.5 F (36.4 C), temperature source Temporal, resp. rate 16, height 5\' 7"  (1.702 m), weight 212 lb 9.6 oz (96.4 kg), SpO2 97 %, unknown if currently breastfeeding. Gen: Alert, well appearing.  Patient is oriented to person, place, time, and situation. AFFECT: pleasant, lucid thought and speech. ENT: Right upper eyelid with 2 mm subQ nodule that is soft, NT.  Lashes normal.  No erythema. EOMI, PERRL. CV: RRR, no m/r/g.   LUNGS:  CTA bilat, nonlabored resps, good aeration in all lung fields. EXT: no clubbing or cyanosis.  no edema.    LABS:    Chemistry      Component Value Date/Time   NA 140 06/22/2019 1448   K 5.4 (H) 06/22/2019 1900   CL 106 06/22/2019 1448   CO2 24 06/22/2019 1448   BUN 19 06/22/2019 1448   CREATININE 1.01 (H) 06/22/2019 1448      Component Value Date/Time   CALCIUM 11.3 (H) 06/22/2019 1448   ALKPHOS 95 06/22/2019 1448   AST 27 06/22/2019 1448   ALT 31 06/22/2019 1448   BILITOT 0.6 06/22/2019 1448     Lab Results  Component Value Date   WBC 16.8 (H) 06/22/2019   HGB 16.0 (H) 06/22/2019   HCT 51.1 (H) 06/22/2019   MCV 89.2 06/22/2019   PLT 298  06/22/2019   Lab Results  Component Value Date   TSH 1.10 02/04/2019   Lab Results  Component Value Date   CHOL 204 (H) 05/21/2019   HDL 34.00 (L) 05/21/2019   LDLDIRECT 128.0 05/21/2019   TRIG 207.0 (H) 05/21/2019   CHOLHDL 6 05/21/2019    IMPRESSION AND PLAN:  1) Epigastric pain: resolved. Covid + at ED visit for her epig pain->? epig pain as her only symptom of this infection? Her CT abd did pick up some ground glass opacities in lungs, which correlates with covid. She had leukocytosis, will repeat CBC today.  2) Hyperkalemia, detected at ED visit for epig pain, covid+.  She reports REMOTE  past hx of elevated potassium, suspect it was mild b/c she was given some dietary recommendations only. Suspect her baseline is a little high.  Her calcium was elevated as well.  Will check phos.  Labs were drawn today before I could add PTH level.  If calcium stable will hold off on PTH check b/c pt asymptomatic. Check BMET today.  3) Chalazion-warm compress + J&J baby shampoo discussed. Signs/symptoms to call or return for were reviewed and pt expressed understanding.  An After Visit Summary was printed and given to the patient.  FOLLOW UP: Return for approx6 mo for CPE.  Signed:  Crissie Sickles, MD           07/28/2019

## 2019-07-29 NOTE — Telephone Encounter (Signed)
Yes, work note is fine.

## 2019-07-30 ENCOUNTER — Telehealth: Payer: Self-pay

## 2019-07-30 NOTE — Telephone Encounter (Signed)
MyChart message sent with results.  

## 2019-08-06 ENCOUNTER — Telehealth: Payer: Self-pay | Admitting: Family Medicine

## 2019-08-06 NOTE — Telephone Encounter (Signed)
Called patient and told her to keep a warm compress on her eye. She stated that Dr. Anitra Lauth had looked at her eye the last time she was in office and said that it was a stye. Patient is going to contact an eye doctor to take a look at her eye. If she cannot get in with them, she will give [place] a call back.

## 2019-08-06 NOTE — Telephone Encounter (Signed)
Pt has a sty on her right eye. She spoke with Dr. Anitra Lauth about it but thought that it was going away but then on Tuesday it started back to be painful and swollen and now her right eye is almost swollen shut. Pt wants to know if there is something that can be sent to the pharmacy or if she should be seen again/ please advise and call Pt at [phone]

## 2019-08-31 ENCOUNTER — Other Ambulatory Visit: Payer: Self-pay | Admitting: Family Medicine

## 2019-10-24 IMAGING — CT CT ABDOMEN AND PELVIS WITH CONTRAST
2 of 4 series · 16 of 46 positions shown, 18 images · IV contrast (APPLIED)
Comparison: None.

CLINICAL DATA: Abdominal pain with nausea. Rash involving the arms
and legs.

EXAM:
CT ABDOMEN AND PELVIS WITH CONTRAST
TECHNIQUE: Multidetector CT imaging of the abdomen and pelvis was performed
using the standard protocol following bolus administration of
intravenous contrast.
CONTRAST:  100mL OMNIPAQUE IOHEXOL 300 MG/ML  SOLN

[Series 3: abdomen 5.0 · axial · 0.98mm/px · z∈[+817,+1342]mm · 13 of 119 slices shown, 15 images]
[im 7/119  soft-tissue]
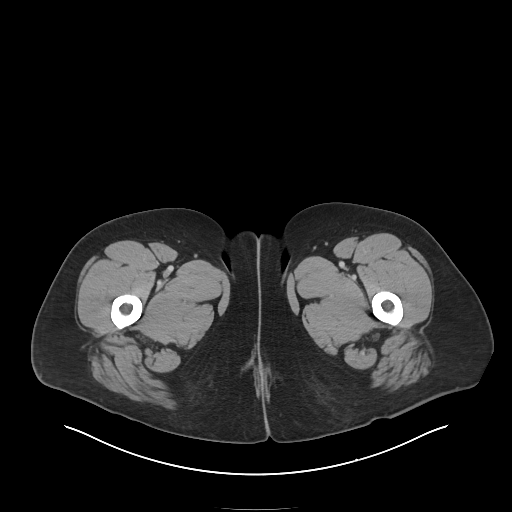
[im 7/119  bone]
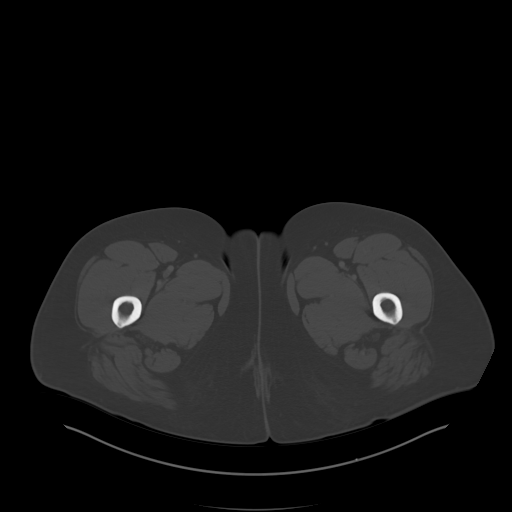
[im 14/119  soft-tissue]
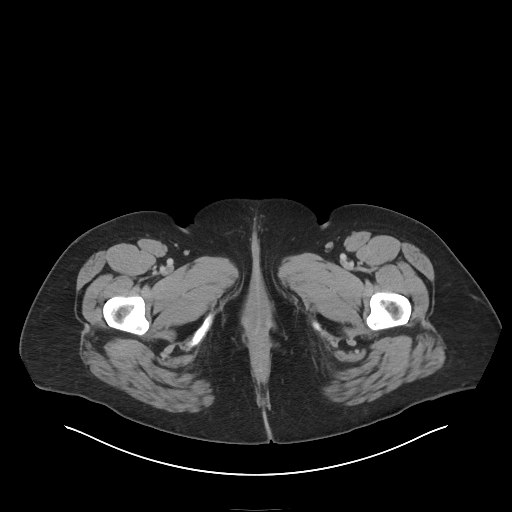
[im 27/119  soft-tissue]
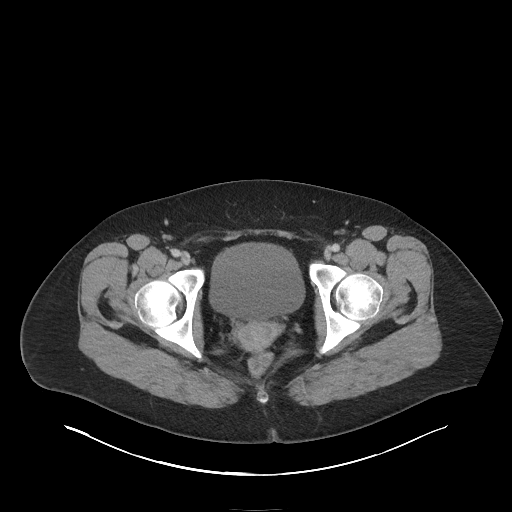
[im 33/119  soft-tissue]
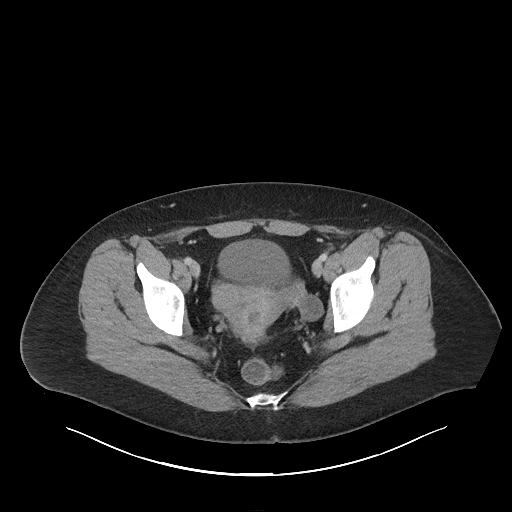
[im 40/119  soft-tissue]
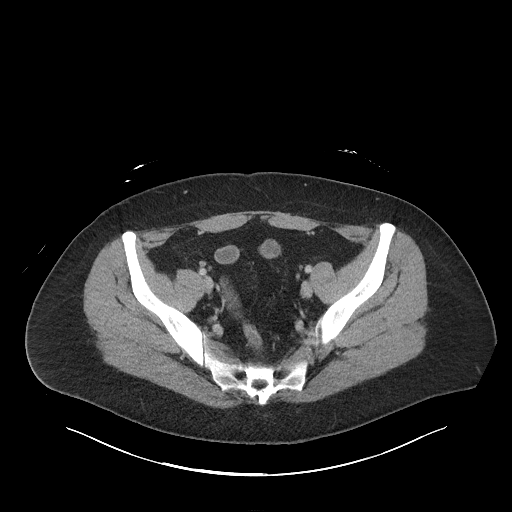
[im 53/119  soft-tissue]
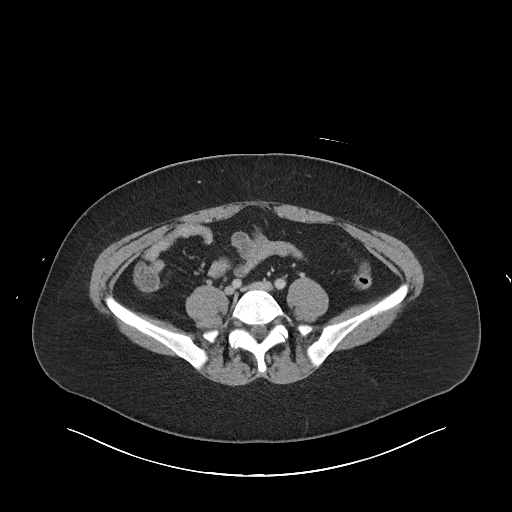
[im 60/119  soft-tissue]
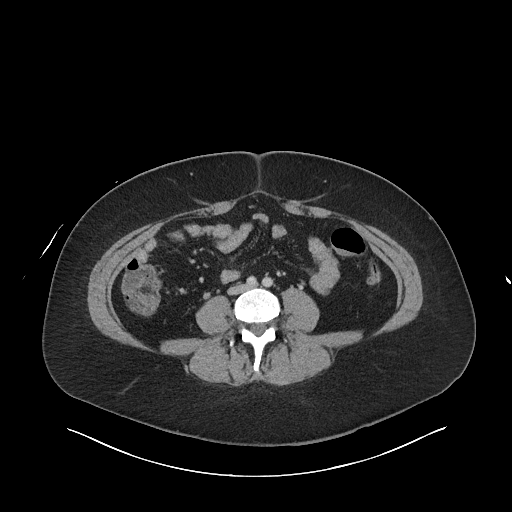
[im 66/119  soft-tissue]
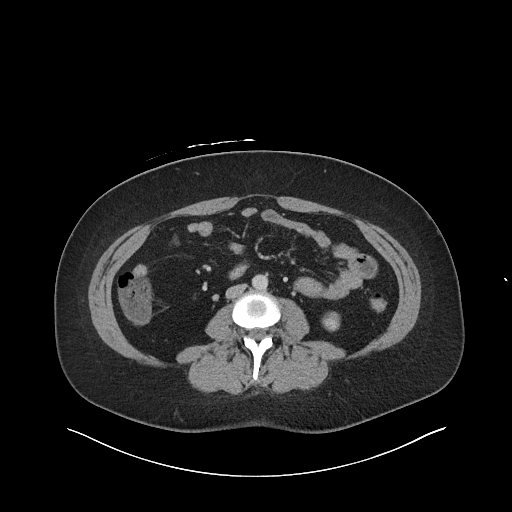
[im 79/119  soft-tissue]
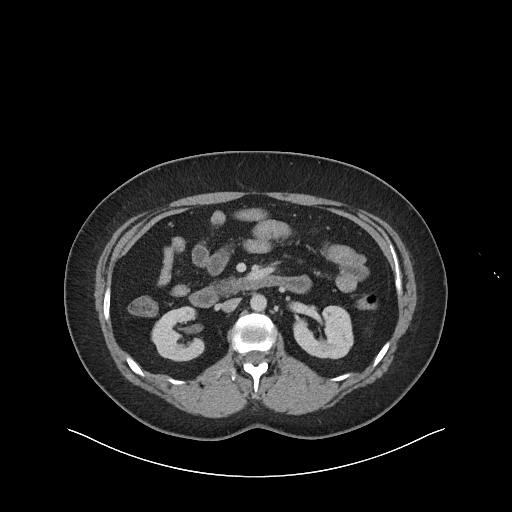
[im 79/119  bone]
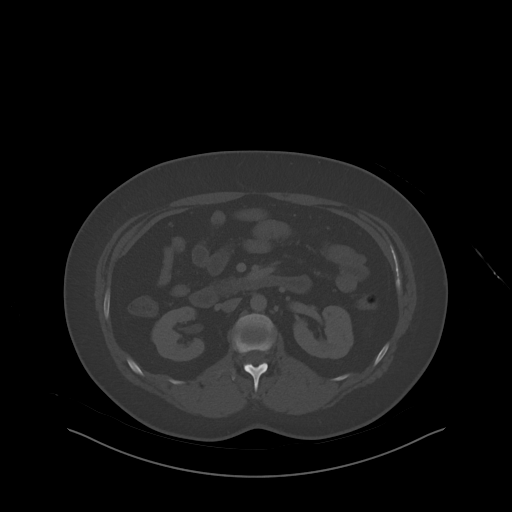
[im 86/119  soft-tissue]
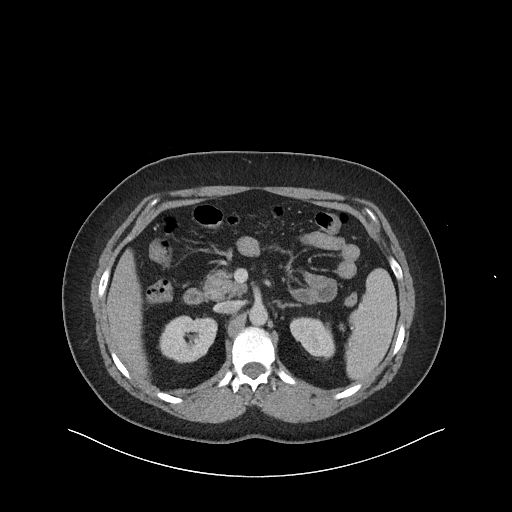
[im 92/119  soft-tissue]
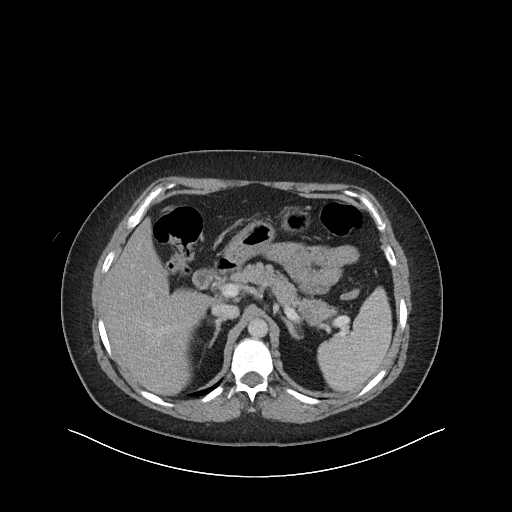
[im 105/119  soft-tissue]
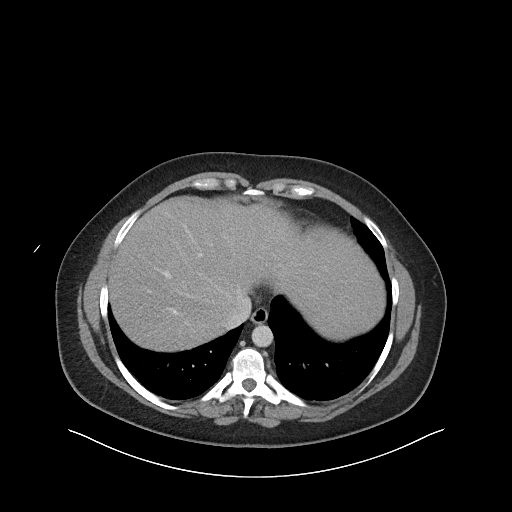
[im 112/119  soft-tissue]
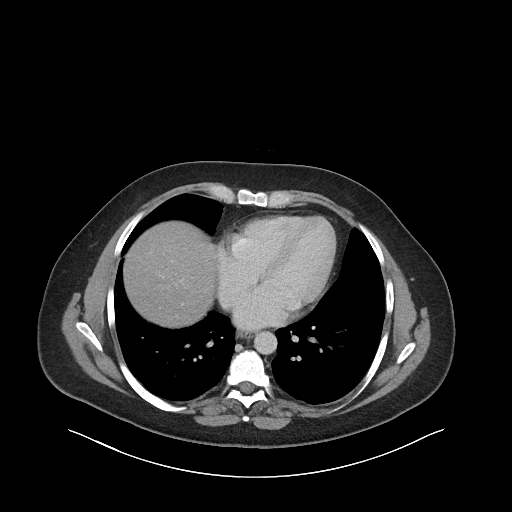

[Series 6: abdomen 3.0 mpr cor · coronal · 1.07mm/px · 3 of 113 slices shown]
[im 38/113  soft-tissue]
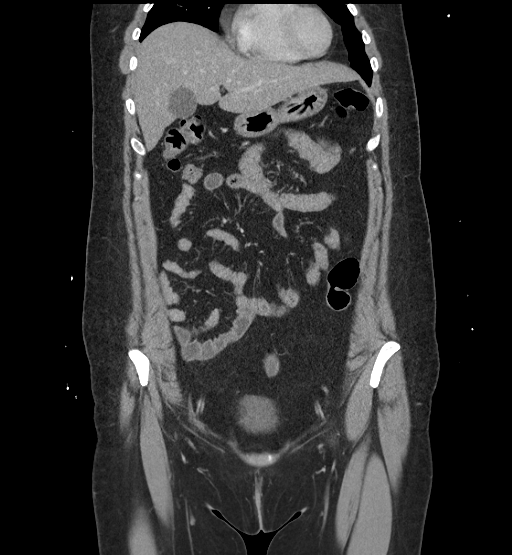
[im 50/113  soft-tissue]
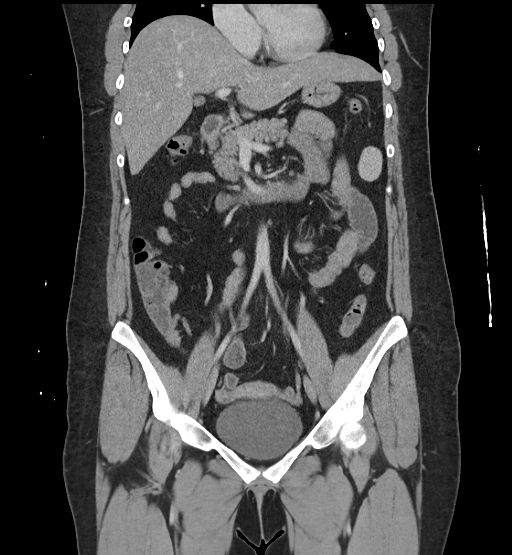
[im 63/113  soft-tissue]
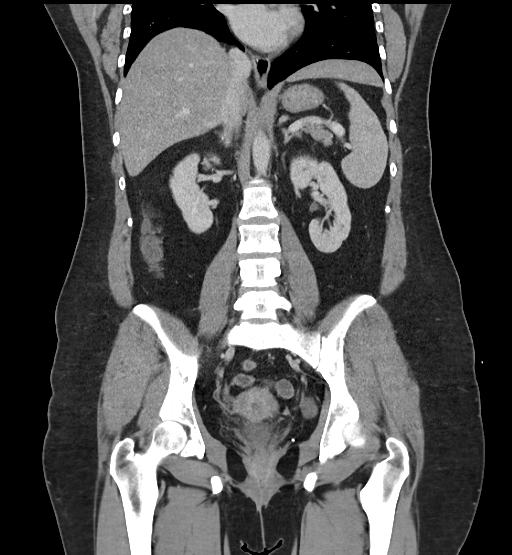

[16 of 46 positions shown; findings below may reference images not displayed]

FINDINGS: Lower chest: Patchy ground-glass opacities are present at both lung
bases, likely inflammatory. There is no consolidation, pleural or
pericardial effusion.

Hepatobiliary: The liver is normal in density without suspicious
focal abnormality. No evidence of gallstones, gallbladder wall
thickening or biliary dilatation.

Pancreas: Unremarkable. No pancreatic ductal dilatation or
surrounding inflammatory changes.

Spleen: Normal in size without focal abnormality.

Adrenals/Urinary Tract: There are punctate calcifications within the
right adrenal gland. The left adrenal gland appears normal. The
kidneys appear normal without evidence of urinary tract calculus,
suspicious lesion or hydronephrosis. No bladder abnormalities are
seen.

Stomach/Bowel: No evidence of bowel wall thickening, distention or
surrounding inflammatory change. The appendix appears normal.

Vascular/Lymphatic: There are no enlarged abdominal or pelvic lymph
nodes. No significant vascular findings.

Reproductive: Low-density areas in the cervix likely represent
nabothian cysts. The uterus otherwise appears normal. No adnexal
mass.

Other: No evidence of abdominal wall mass or hernia. No ascites.

Musculoskeletal: No acute or significant osseous findings.
IMPRESSION: 1. No acute abdominal findings or explanation for the patient's
symptoms.
2. Linear calcifications in the right adrenal gland, likely due to
prior hemorrhage or inflammation.
3. Patchy ground-glass opacities at both lung bases, nonspecific,
although likely inflammatory. Consider viral pneumonia and testing
for [id number] infection. These results were called by telephone at
the time of interpretation on 06/22/2019 at [DATE] to Dr. HIROE
MACY , who verbally acknowledged these results.

## 2019-12-31 ENCOUNTER — Telehealth: Payer: Self-pay

## 2019-12-31 MED ORDER — LABETALOL HCL 200 MG PO TABS: 200.0000 mg | ORAL_TABLET | Freq: Two times a day (BID) | ORAL | 3 refills | Status: DC

## 2019-12-31 NOTE — Telephone Encounter (Signed)
Patient needs refill on her blood pressure meds  She has an appt for her yearly physical on 02/05/20  labetalol (NORMODYNE) 200 MG tablet [id number]   [id number] [place], [place] - [address] AT [place]

## 2019-12-31 NOTE — Telephone Encounter (Signed)
RF request for Labetalol LOV:07/28/19 Next ov: 02/05/20 CPE Last written: 02/05/19 (1,0)  Please Advise.

## 2019-12-31 NOTE — Telephone Encounter (Signed)
OK, labetalol eRx'd

## 2020-01-25 DIAGNOSIS — E210 Primary hyperparathyroidism: Secondary | ICD-10-CM

## 2020-01-25 HISTORY — DX: Primary hyperparathyroidism: E21.0

## 2020-02-02 ENCOUNTER — Other Ambulatory Visit: Payer: Self-pay

## 2020-02-02 DIAGNOSIS — I10 Essential (primary) hypertension: Secondary | ICD-10-CM | POA: Insufficient documentation

## 2020-02-05 ENCOUNTER — Other Ambulatory Visit: Payer: Self-pay

## 2020-02-05 ENCOUNTER — Encounter: Payer: Self-pay | Admitting: Family Medicine

## 2020-02-05 ENCOUNTER — Ambulatory Visit (INDEPENDENT_AMBULATORY_CARE_PROVIDER_SITE_OTHER): Payer: BC Managed Care – PPO | Admitting: Family Medicine

## 2020-02-05 VITALS — BP 112/73 | HR 61 | Temp 97.6°F | Resp 16 | Ht 67.0 in | Wt 216.2 lb

## 2020-02-05 DIAGNOSIS — Z0000 Encounter for general adult medical examination without abnormal findings: Secondary | ICD-10-CM

## 2020-02-05 DIAGNOSIS — E781 Pure hyperglyceridemia: Secondary | ICD-10-CM

## 2020-02-05 DIAGNOSIS — K219 Gastro-esophageal reflux disease without esophagitis: Secondary | ICD-10-CM

## 2020-02-05 DIAGNOSIS — E039 Hypothyroidism, unspecified: Secondary | ICD-10-CM

## 2020-02-05 DIAGNOSIS — Z79899 Other long term (current) drug therapy: Secondary | ICD-10-CM

## 2020-02-05 DIAGNOSIS — I10 Essential (primary) hypertension: Secondary | ICD-10-CM | POA: Diagnosis not present

## 2020-02-05 DIAGNOSIS — E669 Obesity, unspecified: Secondary | ICD-10-CM

## 2020-02-05 MED ORDER — PANTOPRAZOLE SODIUM 40 MG PO TBEC: 40.0000 mg | DELAYED_RELEASE_TABLET | Freq: Every day | ORAL | 3 refills | Status: DC

## 2020-02-05 MED ORDER — GEMFIBROZIL 600 MG PO TABS: 600.0000 mg | ORAL_TABLET | Freq: Two times a day (BID) | ORAL | 3 refills | Status: DC

## 2020-02-05 MED ORDER — LEVOTHYROXINE SODIUM 100 MCG PO TABS: ORAL_TABLET | ORAL | 3 refills | Status: DC

## 2020-02-05 NOTE — Patient Instructions (Signed)
Health Maintenance, Female Adopting a healthy lifestyle and getting preventive care are important in promoting health and wellness. Ask your health care provider about:  The right schedule for you to have regular tests and exams.  Things you can do on your own to prevent diseases and keep yourself healthy. What should I know about diet, weight, and exercise? Eat a healthy diet   Eat a diet that includes plenty of vegetables, fruits, low-fat dairy products, and lean protein.  Do not eat a lot of foods that are high in solid fats, added sugars, or sodium. Maintain a healthy weight Body mass index (BMI) is used to identify weight problems. It estimates body fat based on height and weight. Your health care provider can help determine your BMI and help you achieve or maintain a healthy weight. Get regular exercise Get regular exercise. This is one of the most important things you can do for your health. Most adults should:  Exercise for at least 150 minutes each week. The exercise should increase your heart rate and make you sweat (moderate-intensity exercise).  Do strengthening exercises at least twice a week. This is in addition to the moderate-intensity exercise.  Spend less time sitting. Even light physical activity can be beneficial. Watch cholesterol and blood lipids Have your blood tested for lipids and cholesterol at 35 years of age, then have this test every 5 years. Have your cholesterol levels checked more often if:  Your lipid or cholesterol levels are high.  You are older than 35 years of age.  You are at high risk for heart disease. What should I know about cancer screening? Depending on your health history and family history, you may need to have cancer screening at various ages. This may include screening for:  Breast cancer.  Cervical cancer.  Colorectal cancer.  Skin cancer.  Lung cancer. What should I know about heart disease, diabetes, and high blood  pressure? Blood pressure and heart disease  High blood pressure causes heart disease and increases the risk of stroke. This is more likely to develop in people who have high blood pressure readings, are of African descent, or are overweight.  Have your blood pressure checked: ? Every 3-5 years if you are 18-39 years of age. ? Every year if you are 40 years old or older. Diabetes Have regular diabetes screenings. This checks your fasting blood sugar level. Have the screening done:  Once every three years after age 40 if you are at a normal weight and have a low risk for diabetes.  More often and at a younger age if you are overweight or have a high risk for diabetes. What should I know about preventing infection? Hepatitis B If you have a higher risk for hepatitis B, you should be screened for this virus. Talk with your health care provider to find out if you are at risk for hepatitis B infection. Hepatitis C Testing is recommended for:  Everyone born from 1945 through 1965.  Anyone with known risk factors for hepatitis C. Sexually transmitted infections (STIs)  Get screened for STIs, including gonorrhea and chlamydia, if: ? You are sexually active and are younger than 35 years of age. ? You are older than 35 years of age and your health care provider tells you that you are at risk for this type of infection. ? Your sexual activity has changed since you were last screened, and you are at increased risk for chlamydia or gonorrhea. Ask your health care provider if   you are at risk.  Ask your health care provider about whether you are at high risk for HIV. Your health care provider may recommend a prescription medicine to help prevent HIV infection. If you choose to take medicine to prevent HIV, you should first get tested for HIV. You should then be tested every 3 months for as long as you are taking the medicine. Pregnancy  If you are about to stop having your period (premenopausal) and  you may become pregnant, seek counseling before you get pregnant.  Take 400 to 800 micrograms (mcg) of folic acid every day if you become pregnant.  Ask for birth control (contraception) if you want to prevent pregnancy. Osteoporosis and menopause Osteoporosis is a disease in which the bones lose minerals and strength with aging. This can result in bone fractures. If you are 65 years old or older, or if you are at risk for osteoporosis and fractures, ask your health care provider if you should:  Be screened for bone loss.  Take a calcium or vitamin D supplement to lower your risk of fractures.  Be given hormone replacement therapy (HRT) to treat symptoms of menopause. Follow these instructions at home: Lifestyle  Do not use any products that contain nicotine or tobacco, such as cigarettes, e-cigarettes, and chewing tobacco. If you need help quitting, ask your health care provider.  Do not use street drugs.  Do not share needles.  Ask your health care provider for help if you need support or information about quitting drugs. Alcohol use  Do not drink alcohol if: ? Your health care provider tells you not to drink. ? You are pregnant, may be pregnant, or are planning to become pregnant.  If you drink alcohol: ? Limit how much you use to 0-1 drink a day. ? Limit intake if you are breastfeeding.  Be aware of how much alcohol is in your drink. In the [place] one drink equals one 12 oz bottle of beer (355 mL), one 5 oz glass of wine (148 mL), or one 1 oz glass of hard liquor (44 mL). General instructions  Schedule regular health, dental, and eye exams.  Stay current with your vaccines.  Tell your health care provider if: ? You often feel depressed. ? You have ever been abused or do not feel safe at home. Summary  Adopting a healthy lifestyle and getting preventive care are important in promoting health and wellness.  Follow your health care provider's instructions about healthy  diet, exercising, and getting tested or screened for diseases.  Follow your health care provider's instructions on monitoring your cholesterol and blood pressure. This information is not intended to replace advice given to you by your health care provider. Make sure you discuss any questions you have with your health care provider. Document Revised: 11/05/2018 Document Reviewed: 11/05/2018 Elsevier Patient Education  2020 Elsevier Inc.  

## 2020-02-05 NOTE — Progress Notes (Signed)
Office Note 02/05/2020  CC:  Chief Complaint  Patient presents with  . Annual Exam    pt is not fasting, minor food    HPI:  Megan Webster is a 34 y.o. White female who is here for annual health maintenance exam. Feeling well. Exercise: elliptical 30 min 5 d/week. Walking at lunch. Diet: not working on anything at this time.  She has hx of infertility, PCOS, has conceived on metformin in the past. Has recently restarted metformin and started seeing a fertility GYN MD. LMP 11/2019, irregular.  Has to take otc omeprazole daily or she gets bad GER sx's. Taking gemfibrozil for hx of moderately severe hypertriglyceridemia in the past and it has helped.   Past Medical History:  Diagnosis Date  . Basal cell carcinoma    nose  . COVID-19 virus infection 05/2019  . GERD (gastroesophageal reflux disease)    omeprazole otc 1 qod  . Hypertension    2016; when pregnant-->continued.  . Hypertriglyceridemia 01/2019   gemfibrozil->pt repsonded well.  . Hypothyroidism 2014 approx  . Obesity, Class I, BMI 30-34.9   . PCOS (polycystic ovarian syndrome)    metformin leading up to pregnancy and then stopped it.    Past Surgical History:  Procedure Laterality Date  . CESAREAN SECTION N/A 09/09/2015   Procedure: CESAREAN SECTION;  Surgeon: Paula Compton, MD;  Location: [facility];  Service: Obstetrics;  Laterality: N/A;  . ENDOMETRIAL FULGURATION  2015  . WISDOM TOOTH EXTRACTION      Family History  Problem Relation Age of Onset  . High blood pressure Mother   . High arches Mother   . High blood pressure Father   . High Cholesterol Father   . Hearing loss Father   . Depression Sister   . Depression Paternal Grandmother   . Heart disease Paternal Grandmother   . Heart attack Paternal Grandfather     Social History   Socioeconomic History  . Marital status: Married    Spouse name: Not on file  . Number of children: Not on file  . Years of education: Not on file  .  Highest education level: Not on file  Occupational History  . Not on file  Tobacco Use  . Smoking status: Never Smoker  . Smokeless tobacco: Never Used  Substance and Sexual Activity  . Alcohol use: No  . Drug use: No  . Sexual activity: Yes    Birth control/protection: None  Other Topics Concern  . Not on file  Social History Narrative   Married, 1 son.   Orig from [facility], went to [place] for a while, relocated to [place] again 2018.   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   No T/A/Ds.   Social Determinants of Health   Financial Resource Strain:   . Difficulty of Paying Living Expenses:   Food Insecurity:   . Worried About Charity fundraiser in the Last Year:   . Arboriculturist in the Last Year:   Transportation Needs:   . Film/video editor (Medical):   Marland Kitchen Lack of Transportation (Non-Medical):   Physical Activity:   . Days of Exercise per Week:   . Minutes of Exercise per Session:   Stress:   . Feeling of Stress :   Social Connections:   . Frequency of Communication with Friends and Family:   . Frequency of Social Gatherings with Friends and Family:   . Attends Religious Services:   .  Active Member of Clubs or Organizations:   . Attends Archivist Meetings:   Marland Kitchen Marital Status:   Intimate Partner Violence:   . Fear of Current or Ex-Partner:   . Emotionally Abused:   Marland Kitchen Physically Abused:   . Sexually Abused:     Outpatient Medications Prior to Visit  Medication Sig Dispense Refill  . labetalol (NORMODYNE) 200 MG tablet Take 1 tablet (200 mg total) by mouth 2 (two) times daily. 180 tablet 3  . metFORMIN (GLUCOPHAGE) 500 MG tablet metformin 500 mg tablet    . gemfibrozil (LOPID) 600 MG tablet TAKE 1 TABLET (600 MG TOTAL) BY MOUTH 2 (TWO) TIMES DAILY BEFORE A MEAL. 180 tablet 3  . levothyroxine (SYNTHROID) 100 MCG tablet TAKE 1 TABLET BY MOUTH DAILY BEFORE BREAKFAST. 90 tablet 1  . Omeprazole  (PRILOSEC PO) omeprazole     No facility-administered medications prior to visit.    No Known Allergies  ROS Review of Systems  Constitutional: Negative for appetite change, chills, fatigue and fever.  HENT: Negative for congestion, dental problem, ear pain and sore throat.   Eyes: Negative for discharge, redness and visual disturbance.  Respiratory: Negative for cough, chest tightness, shortness of breath and wheezing.   Cardiovascular: Negative for chest pain, palpitations and leg swelling.  Gastrointestinal: Negative for abdominal pain, blood in stool, diarrhea, nausea and vomiting.  Genitourinary: Negative for difficulty urinating, dysuria, flank pain, frequency, hematuria and urgency.  Musculoskeletal: Negative for arthralgias, back pain, joint swelling, myalgias and neck stiffness.  Skin: Negative for pallor and rash.  Neurological: Negative for dizziness, speech difficulty, weakness and headaches.  Hematological: Negative for adenopathy. Does not bruise/bleed easily.  Psychiatric/Behavioral: Negative for confusion and sleep disturbance. The patient is not nervous/anxious.     PE; Blood pressure 112/73, pulse 61, temperature 97.6 F (36.4 C), temperature source Temporal, resp. rate 16, height 5\' 7"  (1.702 m), weight 216 lb 3.2 oz (98.1 kg), last menstrual period 12/04/2019, SpO2 96 %, unknown if currently breastfeeding.  Exam chaperoned by Deveron Furlong, CMA.  Gen: Alert, well appearing.  Patient is oriented to person, place, time, and situation. AFFECT: pleasant, lucid thought and speech. ENT: Ears: EACs clear, normal epithelium.  TMs with good light reflex and landmarks bilaterally.  Eyes: no injection, icteris, swelling, or exudate.  EOMI, PERRLA. Nose: no drainage or turbinate edema/swelling.  No injection or focal lesion.  Mouth: lips without lesion/swelling.  Oral mucosa pink and moist.  Dentition intact and without obvious caries or gingival swelling.  Oropharynx  without erythema, exudate, or swelling.  Neck: supple/nontender.  No LAD, mass, or TM.  Carotid pulses 2+ bilaterally, without bruits. CV: RRR, no m/r/g.   LUNGS: CTA bilat, nonlabored resps, good aeration in all lung fields. ABD: soft, NT, ND, BS normal.  No hepatospenomegaly or mass.  No bruits. EXT: no clubbing, cyanosis, or edema.  Musculoskeletal: no joint swelling, erythema, warmth, or tenderness.  ROM of all joints intact. Skin - no sores or suspicious lesions or rashes or color changes   Pertinent labs:  Lab Results  Component Value Date   TSH 1.10 02/04/2019   Lab Results  Component Value Date   WBC 6.3 07/28/2019   HGB 13.3 07/28/2019   HCT 42.5 07/28/2019   MCV 90.3 07/28/2019   PLT 254.0 07/28/2019   Lab Results  Component Value Date   CREATININE 0.85 07/28/2019   BUN 16 07/28/2019   NA 140 07/28/2019   K 4.8 07/28/2019   CL 107  07/28/2019   CO2 22 07/28/2019   Lab Results  Component Value Date   ALT 31 06/22/2019   AST 27 06/22/2019   ALKPHOS 95 06/22/2019   BILITOT 0.6 06/22/2019   Lab Results  Component Value Date   CHOL 204 (H) 05/21/2019   Lab Results  Component Value Date   HDL 34.00 (L) 05/21/2019   No results found for: [facility] Lab Results  Component Value Date   TRIG 207.0 (H) 05/21/2019   Lab Results  Component Value Date   CHOLHDL 6 05/21/2019   ASSESSMENT AND PLAN:   Health maintenance exam: Reviewed age and gender appropriate health maintenance issues (prudent diet, regular exercise, health risks of tobacco and excessive alcohol, use of seatbelts, fire alarms in home, use of sunscreen).  Also reviewed age and gender appropriate health screening as well as vaccine recommendations. Vaccines: Tdap UTD. Labs: CBC, CMET, FLP, TSH, intact PTH (hypoth, htn, hypertrig, hypercalcemia).  She had some yogurt about 8 hours ago and had an apple just before coming to appt today. Cervical ca screening: last pap was approximately 12/2019. Breast  ca screening: start annual mammograms at age 40 yrs. She wants to start weight watchers to help with wt loss.  Infertility: reviewed med list and she'll need to d/c gemfibrozil when she gets pregnant but can continue for now.  We'll change her PPI to pantoprazole 40mg  qd which she can continue through pregnancy. Continue labetalol for well controlled htn and renewed T4 today.  An After Visit Summary was printed and given to the patient.  FOLLOW UP:  Return in about 6 months (around 08/07/2020) for routine chronic illness f/u.  Signed:  Crissie Sickles, MD           02/05/2020

## 2020-02-06 LAB — CBC WITH DIFFERENTIAL/PLATELET
Absolute Monocytes: 442 cells/uL (ref 200–950)
Basophils Absolute: 41 cells/uL (ref 0–200)
Basophils Relative: 0.6 %
Eosinophils Absolute: 90 cells/uL (ref 15–500)
Eosinophils Relative: 1.3 %
HCT: 39.9 % (ref 35.0–45.0)
Hemoglobin: 13.1 g/dL (ref 11.7–15.5)
Lymphs Abs: 2305 cells/uL (ref 850–3900)
MCH: 28.2 pg (ref 27.0–33.0)
MCHC: 32.8 g/dL (ref 32.0–36.0)
MCV: 86 fL (ref 80.0–100.0)
MPV: 13.6 fL — ABNORMAL HIGH (ref 7.5–12.5)
Monocytes Relative: 6.4 %
Neutro Abs: 4023 cells/uL (ref 1500–7800)
Neutrophils Relative %: 58.3 %
Platelets: 263 10*3/uL (ref 140–400)
RBC: 4.64 10*6/uL (ref 3.80–5.10)
RDW: 14.1 % (ref 11.0–15.0)
Total Lymphocyte: 33.4 %
WBC: 6.9 10*3/uL (ref 3.8–10.8)

## 2020-02-06 LAB — COMPREHENSIVE METABOLIC PANEL
AG Ratio: 1.7 (calc) (ref 1.0–2.5)
ALT: 30 U/L — ABNORMAL HIGH (ref 6–29)
AST: 28 U/L (ref 10–30)
Albumin: 4.7 g/dL (ref 3.6–5.1)
Alkaline phosphatase (APISO): 94 U/L (ref 31–125)
BUN: 17 mg/dL (ref 7–25)
CO2: 22 mmol/L (ref 20–32)
Calcium: 11.4 mg/dL — ABNORMAL HIGH (ref 8.6–10.2)
Chloride: 105 mmol/L (ref 98–110)
Creat: 0.94 mg/dL (ref 0.50–1.10)
Globulin: 2.7 g/dL (calc) (ref 1.9–3.7)
Glucose, Bld: 84 mg/dL (ref 65–99)
Potassium: 4.2 mmol/L (ref 3.5–5.3)
Sodium: 140 mmol/L (ref 135–146)
Total Bilirubin: 0.4 mg/dL (ref 0.2–1.2)
Total Protein: 7.4 g/dL (ref 6.1–8.1)

## 2020-02-06 LAB — LIPID PANEL
Cholesterol: 182 mg/dL (ref <200)
HDL: 33 mg/dL — ABNORMAL LOW (ref >=50)
LDL Cholesterol (Calc): 123 mg/dL (calc) — ABNORMAL HIGH
Non-HDL Cholesterol (Calc): 149 mg/dL (calc) — ABNORMAL HIGH (ref <130)
Total CHOL/HDL Ratio: 5.5 (calc) — ABNORMAL HIGH (ref <5.0)
Triglycerides: 150 mg/dL — ABNORMAL HIGH (ref <150)

## 2020-02-06 LAB — TSH: TSH: 1.48 mIU/L

## 2020-02-08 LAB — PARATHYROID HORMONE, INTACT (NO CA): PTH: 167 pg/mL — ABNORMAL HIGH (ref 14–64)

## 2020-02-09 ENCOUNTER — Encounter: Payer: Self-pay | Admitting: Family Medicine

## 2020-02-12 DIAGNOSIS — N979 Female infertility, unspecified: Secondary | ICD-10-CM | POA: Insufficient documentation

## 2020-02-29 ENCOUNTER — Encounter: Payer: Self-pay | Admitting: Family Medicine

## 2020-03-23 ENCOUNTER — Other Ambulatory Visit: Payer: Self-pay | Admitting: Family Medicine

## 2020-08-11 ENCOUNTER — Ambulatory Visit: Payer: BC Managed Care – PPO | Admitting: Family Medicine

## 2020-08-11 ENCOUNTER — Other Ambulatory Visit: Payer: Self-pay

## 2020-08-11 ENCOUNTER — Encounter: Payer: Self-pay | Admitting: Family Medicine

## 2020-08-11 VITALS — BP 126/68 | HR 66 | Temp 97.6°F | Resp 16 | Ht 67.0 in | Wt 214.8 lb

## 2020-08-11 DIAGNOSIS — I10 Essential (primary) hypertension: Secondary | ICD-10-CM | POA: Diagnosis not present

## 2020-08-11 DIAGNOSIS — K219 Gastro-esophageal reflux disease without esophagitis: Secondary | ICD-10-CM | POA: Diagnosis not present

## 2020-08-11 DIAGNOSIS — E039 Hypothyroidism, unspecified: Secondary | ICD-10-CM

## 2020-08-11 DIAGNOSIS — E210 Primary hyperparathyroidism: Secondary | ICD-10-CM

## 2020-08-11 DIAGNOSIS — G4733 Obstructive sleep apnea (adult) (pediatric): Secondary | ICD-10-CM

## 2020-08-11 DIAGNOSIS — E282 Polycystic ovarian syndrome: Secondary | ICD-10-CM

## 2020-08-11 DIAGNOSIS — E781 Pure hyperglyceridemia: Secondary | ICD-10-CM

## 2020-08-11 NOTE — Progress Notes (Signed)
OFFICE VISIT  08/11/2020  CC:  Chief Complaint  Patient presents with  . Follow-up    RCI, pt is fasting    HPI:    Patient is a 34 y.o. Caucasian female who presents for 6 mo f/u HTN, GERD, hypertriglyceridemia, hypothyroidism, and hypercalcemia due to primary hyperparathyroidism.  Feeling tired. Had endometrial procedure approx 3 mo ago.  She was under some light anesthesia for this and her GYN told her afterward that she should be evaluated for OSA.  Question of snoring and witnessed apnea at home. She does have significant excessive daytime sleepiness.  Goes to bed at 10 pm and up at 5: 30.  HTN: no home monitoring. Hypertriglyceridemia: taking gemfibrozil and tolerating this.  Takes T4 on empty stomach along with meformin, labetolol, and gemfibrozil. GERD: taking PPI qPM and this controls her reflux.  ROS: no fevers, no CP, no SOB, no wheezing, no cough, no dizziness, no HAs, no rashes, no melena/hematochezia.  No polyuria or polydipsia.  No myalgias or arthralgias.  No focal weakness, paresthesias, or tremors.  No acute vision or hearing abnormalities. No n/v/d or abd pain.  No palpitations.    Past Medical History:  Diagnosis Date  . Basal cell carcinoma    nose  . Cervical cancer screening    PAP via GYN 01/08/20 NEG cytology but + HPV-->plan repeat 1 yr due to HPV +.  . COVID-19 virus infection 05/2019  . GERD (gastroesophageal reflux disease)    omeprazole otc 1 qod  . Hypercalcemia    mild, asympt, +mild hyperparathyroidism  . Hypertension    2016; when pregnant-->continued.  . Hypertriglyceridemia 01/2019   gemfibrozil->pt repsonded well.  . Hypothyroidism 2014 approx  . Obesity, Class I, BMI 30-34.9   . PCOS (polycystic ovarian syndrome)    metformin leading up to pregnancy and then stopped it.  . Primary hyperparathyroidism ([place]) 01/2020    Past Surgical History:  Procedure Laterality Date  . CESAREAN SECTION N/A 09/09/2015   Procedure: CESAREAN  SECTION;  Surgeon: Paula Compton, MD;  Location: [facility];  Service: Obstetrics;  Laterality: N/A;  . ENDOMETRIAL FULGURATION  2015  . WISDOM TOOTH EXTRACTION      Outpatient Medications Prior to Visit  Medication Sig Dispense Refill  . gemfibrozil (LOPID) 600 MG tablet Take 1 tablet (600 mg total) by mouth 2 (two) times daily before a meal. 180 tablet 3  . labetalol (NORMODYNE) 200 MG tablet Take 1 tablet (200 mg total) by mouth 2 (two) times daily. 180 tablet 3  . levothyroxine (SYNTHROID) 100 MCG tablet TAKE 1 TABLET BY MOUTH EVERY DAY BEFORE BREAKFAST 90 tablet 1  . metFORMIN (GLUCOPHAGE) 500 MG tablet metformin 500 mg tablet    . pantoprazole (PROTONIX) 40 MG tablet Take 1 tablet (40 mg total) by mouth daily. 90 tablet 3  . Prenatal Vit-Fe Fumarate-FA (PRENATAL COMPLETE PO) Take by mouth daily.    Marland Kitchen letrozole (FEMARA) 2.5 MG tablet SMARTSIG:3 By Mouth Once a Week (Patient not taking: Reported on 08/11/2020)     No facility-administered medications prior to visit.    No Known Allergies  ROS As per HPI  PE: Vitals with BMI 08/11/2020 02/05/2020 07/28/2019  Height 5\' 7"  5\' 7"  5\' 7"   Weight 214 lbs 13 oz 216 lbs 3 oz 212 lbs 10 oz  BMI 33.63 24.40 10.27  Systolic 253 664 403  Diastolic 68 73 75  Pulse 66 61 53     Gen: Alert, well appearing.  Patient is oriented  to person, place, time, and situation. affectn [id number]: no injection, icteris, swelling, or exudate.  EOMI, PERRLA. Mouth: lips without lesion/swelling.  Oral mucosa pink and moist. Oropharynx without erythema, exudate, or swelling.  Neck - No masses or thyromegaly or limitation in range of motion CV: RRR, no m/r/g.   LUNGS: CTA bilat, nonlabored resps, good aeration in all lung fields. EXT: no clubbing or cyanosis.  no edema.    LABS:  Lab Results  Component Value Date   TSH 1.48 02/05/2020   Lab Results  Component Value Date   WBC 6.9 02/05/2020   HGB 13.1 02/05/2020   HCT 39.9 02/05/2020   MCV 86.0  02/05/2020   PLT 263 02/05/2020   Lab Results  Component Value Date   CREATININE 0.94 02/05/2020   BUN 17 02/05/2020   NA 140 02/05/2020   K 4.2 02/05/2020   CL 105 02/05/2020   CO2 22 02/05/2020   Lab Results  Component Value Date   ALT 30 (H) 02/05/2020   AST 28 02/05/2020   ALKPHOS 95 06/22/2019   BILITOT 0.4 02/05/2020   Lab Results  Component Value Date   CHOL 182 02/05/2020   Lab Results  Component Value Date   HDL 33 (L) 02/05/2020   Lab Results  Component Value Date   LDLCALC 123 (H) 02/05/2020   Lab Results  Component Value Date   TRIG 150 (H) 02/05/2020   Lab Results  Component Value Date   CHOLHDL 5.5 (H) 02/05/2020   Lab Results  Component Value Date   PTH 167 (H) 02/05/2020   CALCIUM 11.4 (H) 02/05/2020   PHOS 2.9 07/28/2019     IMPRESSION AND PLAN:  1) HTN: normal bp today.  Encouraged pt to start occ bp monitoring at home. No change in med.  Lytes/cr future.  2) Hypertriglyceridemia: doing well on gemfibrozil. Not fasting today.  FLP and hepatic panel future.  3) Hypothyroidism: takes T4 consistently (see HPI). TSH --future.  4) GERD: stable on daily PPI that she takes in evenings.  5) Primary hyperparathyroidism with mild hypercalcemia: monitor intact PTH, Ca, and Phos today. The only supplemental calcium she takes is what is in her PNV.  6) PCOS: GYN managing.  She is on metformin 500 mg. Check fasting glucose and HbA1c future.  7) Excessive daytime sleepiness: Quality of sleep questionnaire:  Score of 9, high risk of OSA. Refer to sleep MD for consideration of sleep study--referred to [facility] today.  An After Visit Summary was printed and given to the patient.  FOLLOW UP: Return in about 6 months (around 02/08/2021) for annual CPE (fasting)--also needs fasting lab appt at her earliest convenience.  Signed:  Crissie Sickles, MD           08/11/2020

## 2020-08-19 ENCOUNTER — Ambulatory Visit (INDEPENDENT_AMBULATORY_CARE_PROVIDER_SITE_OTHER): Payer: BC Managed Care – PPO

## 2020-08-19 ENCOUNTER — Other Ambulatory Visit: Payer: Self-pay

## 2020-08-19 DIAGNOSIS — E039 Hypothyroidism, unspecified: Secondary | ICD-10-CM | POA: Diagnosis not present

## 2020-08-19 DIAGNOSIS — E282 Polycystic ovarian syndrome: Secondary | ICD-10-CM | POA: Diagnosis not present

## 2020-08-19 DIAGNOSIS — E781 Pure hyperglyceridemia: Secondary | ICD-10-CM | POA: Diagnosis not present

## 2020-08-19 DIAGNOSIS — I10 Essential (primary) hypertension: Secondary | ICD-10-CM | POA: Diagnosis not present

## 2020-08-19 DIAGNOSIS — E210 Primary hyperparathyroidism: Secondary | ICD-10-CM | POA: Diagnosis not present

## 2020-08-19 LAB — COMPREHENSIVE METABOLIC PANEL
ALT: 26 U/L (ref 0–35)
AST: 25 U/L (ref 0–37)
Albumin: 4.6 g/dL (ref 3.5–5.2)
Alkaline Phosphatase: 83 U/L (ref 39–117)
BUN: 18 mg/dL (ref 6–23)
CO2: 24 mEq/L (ref 19–32)
Calcium: 10.9 mg/dL — ABNORMAL HIGH (ref 8.4–10.5)
Chloride: 104 mEq/L (ref 96–112)
Creatinine, Ser: 0.85 mg/dL (ref 0.40–1.20)
GFR: 75.94 mL/min (ref >60.00)
Glucose, Bld: 89 mg/dL (ref 70–99)
Potassium: 4.5 mEq/L (ref 3.5–5.1)
Sodium: 139 mEq/L (ref 135–145)
Total Bilirubin: 0.4 mg/dL (ref 0.2–1.2)
Total Protein: 7.3 g/dL (ref 6.0–8.3)

## 2020-08-19 LAB — HEMOGLOBIN A1C: Hgb A1c MFr Bld: 5.5 % (ref 4.6–6.5)

## 2020-08-19 LAB — LIPID PANEL
Cholesterol: 192 mg/dL (ref 0–200)
HDL: 31.8 mg/dL — ABNORMAL LOW (ref >39.00)
NonHDL: 159.91
Total CHOL/HDL Ratio: 6
Triglycerides: 312 mg/dL — ABNORMAL HIGH (ref 0.0–149.0)
VLDL: 62.4 mg/dL — ABNORMAL HIGH (ref 0.0–40.0)

## 2020-08-19 LAB — LDL CHOLESTEROL, DIRECT: Direct LDL: 104 mg/dL

## 2020-08-19 LAB — TSH: TSH: 1.79 u[IU]/mL (ref 0.35–4.50)

## 2020-08-19 LAB — PHOSPHORUS: Phosphorus: 2.7 mg/dL (ref 2.3–4.6)

## 2020-08-24 LAB — PTH, INTACT AND CALCIUM
Calcium: 10.9 mg/dL — ABNORMAL HIGH (ref 8.6–10.2)
PTH: 160 pg/mL — ABNORMAL HIGH (ref 14–64)

## 2020-09-22 ENCOUNTER — Institutional Professional Consult (permissible substitution): Payer: Self-pay | Admitting: Neurology

## 2020-10-17 ENCOUNTER — Institutional Professional Consult (permissible substitution): Payer: Self-pay | Admitting: Neurology

## 2020-11-08 ENCOUNTER — Institutional Professional Consult (permissible substitution): Payer: Self-pay | Admitting: Neurology

## 2020-12-12 ENCOUNTER — Institutional Professional Consult (permissible substitution): Payer: Self-pay | Admitting: Neurology

## 2020-12-23 ENCOUNTER — Other Ambulatory Visit: Payer: Self-pay | Admitting: Family Medicine

## 2021-01-26 ENCOUNTER — Other Ambulatory Visit: Payer: Self-pay | Admitting: Family Medicine

## 2021-02-16 ENCOUNTER — Other Ambulatory Visit: Payer: Self-pay | Admitting: Family Medicine

## 2021-02-20 ENCOUNTER — Other Ambulatory Visit: Payer: Self-pay | Admitting: Family Medicine

## 2021-02-24 ENCOUNTER — Other Ambulatory Visit: Payer: Self-pay

## 2021-02-27 ENCOUNTER — Ambulatory Visit (INDEPENDENT_AMBULATORY_CARE_PROVIDER_SITE_OTHER): Payer: BC Managed Care – PPO | Admitting: Family Medicine

## 2021-02-27 ENCOUNTER — Other Ambulatory Visit: Payer: Self-pay

## 2021-02-27 ENCOUNTER — Encounter: Payer: Self-pay | Admitting: Family Medicine

## 2021-02-27 VITALS — BP 128/78 | HR 54 | Temp 97.5°F | Ht 67.0 in | Wt 196.4 lb

## 2021-02-27 DIAGNOSIS — E282 Polycystic ovarian syndrome: Secondary | ICD-10-CM | POA: Diagnosis not present

## 2021-02-27 DIAGNOSIS — I10 Essential (primary) hypertension: Secondary | ICD-10-CM | POA: Diagnosis not present

## 2021-02-27 DIAGNOSIS — E039 Hypothyroidism, unspecified: Secondary | ICD-10-CM

## 2021-02-27 DIAGNOSIS — E210 Primary hyperparathyroidism: Secondary | ICD-10-CM

## 2021-02-27 DIAGNOSIS — Z0000 Encounter for general adult medical examination without abnormal findings: Secondary | ICD-10-CM | POA: Diagnosis not present

## 2021-02-27 DIAGNOSIS — E781 Pure hyperglyceridemia: Secondary | ICD-10-CM

## 2021-02-27 LAB — CBC WITH DIFFERENTIAL/PLATELET
Basophils Absolute: 0 10*3/uL (ref 0.0–0.1)
Basophils Relative: 0.7 % (ref 0.0–3.0)
Eosinophils Absolute: 0.1 10*3/uL (ref 0.0–0.7)
Eosinophils Relative: 1.1 % (ref 0.0–5.0)
HCT: 41 % (ref 36.0–46.0)
Hemoglobin: 13.3 g/dL (ref 12.0–15.0)
Lymphocytes Relative: 30.4 % (ref 12.0–46.0)
Lymphs Abs: 1.9 10*3/uL (ref 0.7–4.0)
MCHC: 32.5 g/dL (ref 30.0–36.0)
MCV: 85.4 fl (ref 78.0–100.0)
Monocytes Absolute: 0.4 10*3/uL (ref 0.1–1.0)
Monocytes Relative: 5.8 % (ref 3.0–12.0)
Neutro Abs: 3.8 10*3/uL (ref 1.4–7.7)
Neutrophils Relative %: 62 % (ref 43.0–77.0)
Platelets: 230 10*3/uL (ref 150.0–400.0)
RBC: 4.81 Mil/uL (ref 3.87–5.11)
RDW: 16.4 % — ABNORMAL HIGH (ref 11.5–15.5)
WBC: 6.1 10*3/uL (ref 4.0–10.5)

## 2021-02-27 LAB — COMPREHENSIVE METABOLIC PANEL
ALT: 31 U/L (ref 0–35)
AST: 25 U/L (ref 0–37)
Albumin: 4.6 g/dL (ref 3.5–5.2)
Alkaline Phosphatase: 85 U/L (ref 39–117)
BUN: 14 mg/dL (ref 6–23)
CO2: 26 mEq/L (ref 19–32)
Calcium: 10.7 mg/dL — ABNORMAL HIGH (ref 8.4–10.5)
Chloride: 108 mEq/L (ref 96–112)
Creatinine, Ser: 0.97 mg/dL (ref 0.40–1.20)
GFR: 75.49 mL/min (ref >60.00)
Glucose, Bld: 80 mg/dL (ref 70–99)
Potassium: 5 mEq/L (ref 3.5–5.1)
Sodium: 142 mEq/L (ref 135–145)
Total Bilirubin: 0.3 mg/dL (ref 0.2–1.2)
Total Protein: 7.2 g/dL (ref 6.0–8.3)

## 2021-02-27 LAB — LIPID PANEL
Cholesterol: 181 mg/dL (ref 0–200)
HDL: 34 mg/dL — ABNORMAL LOW (ref >39.00)
LDL Cholesterol: 114 mg/dL — ABNORMAL HIGH (ref 0–99)
NonHDL: 146.52
Total CHOL/HDL Ratio: 5
Triglycerides: 163 mg/dL — ABNORMAL HIGH (ref 0.0–149.0)
VLDL: 32.6 mg/dL (ref 0.0–40.0)

## 2021-02-27 LAB — TSH: TSH: 1.07 u[IU]/mL (ref 0.35–4.50)

## 2021-02-27 LAB — HEMOGLOBIN A1C: Hgb A1c MFr Bld: 5.3 % (ref 4.6–6.5)

## 2021-02-27 NOTE — Progress Notes (Signed)
Office Note 02/27/2021  CC:  Chief Complaint  Patient presents with  . Annual Exam    HPI:  Megan Webster is a 35 y.o. White female who is here for annual health maintenance exam and 6 mo f/u HTN, hypertrig, hypothyroidism, and primary hyperparathyroidism. A/P as of last visit: "1) HTN: normal bp today.  Encouraged pt to start occ bp monitoring at home. No change in med.  Lytes/cr future.  2) Hypertriglyceridemia: doing well on gemfibrozil. Not fasting today.  FLP and hepatic panel future.  3) Hypothyroidism: takes T4 consistently (see HPI). TSH --future.  4) GERD: stable on daily PPI that she takes in evenings.  5) Primary hyperparathyroidism with mild hypercalcemia: monitor intact PTH, Ca, and Phos today. The only supplemental calcium she takes is what is in her PNV.  6) PCOS: GYN managing.  She is on metformin 500 mg. Check fasting glucose and HbA1c future.  7) Excessive daytime sleepiness: Quality of sleep questionnaire:  Score of 9, high risk of OSA. Refer to sleep MD for consideration of sleep study--referred to [facility] today."  INTERIM HX: Last visit her trigs were up some to 312 but no med change made.  Had routine GYN/infertility f/u this morning.   GYN visit for pap with Dr. Melba Coon was few months ago and all normal per pt.  HTN: no home bp monitoring  No GER if taking ppi qd.  Doesn't miss doses.  Hypoth: Takes T4 on empty stomach along with her gemfibrozil and labetalol. Takes her PPI at night.  Hyperparath: takes PNV but avoids any other form of vit D or calcium supplement.  Exercises with elliptical daily, hand wts and kettle bell with this. Diet: low carb/sugar last 3 mo.  Past Medical History:  Diagnosis Date  . Basal cell carcinoma    nose  . Cervical cancer screening    PAP via GYN 01/08/20 NEG cytology but + HPV-->plan repeat 1 yr due to HPV +.  . COVID-19 virus infection 05/2019  . GERD (gastroesophageal reflux disease)    omeprazole  otc 1 qod  . Hypercalcemia    mild, asympt, +mild hyperparathyroidism  . Hypertension    2016; when pregnant-->continued.  . Hypertriglyceridemia 01/2019   gemfibrozil->pt repsonded well.  . Hypothyroidism 2014 approx  . Obesity, Class I, BMI 30-34.9   . PCOS (polycystic ovarian syndrome)    metformin leading up to pregnancy and then stopped it.  . Primary hyperparathyroidism ([place]) 01/2020    Past Surgical History:  Procedure Laterality Date  . CESAREAN SECTION N/A 09/09/2015   Procedure: CESAREAN SECTION;  Surgeon: Paula Compton, MD;  Location: [facility];  Service: Obstetrics;  Laterality: N/A;  . ENDOMETRIAL FULGURATION  2015  . WISDOM TOOTH EXTRACTION      Family History  Problem Relation Age of Onset  . High blood pressure Mother   . High arches Mother   . High blood pressure Father   . High Cholesterol Father   . Hearing loss Father   . Depression Sister   . Depression Paternal Grandmother   . Heart disease Paternal Grandmother   . Heart attack Paternal Grandfather     Social History   Socioeconomic History  . Marital status: Married    Spouse name: Not on file  . Number of children: Not on file  . Years of education: Not on file  . Highest education level: Not on file  Occupational History  . Not on file  Tobacco Use  . Smoking status: Never Smoker  .  Smokeless tobacco: Never Used  Substance and Sexual Activity  . Alcohol use: No  . Drug use: No  . Sexual activity: Yes    Birth control/protection: None  Other Topics Concern  . Not on file  Social History Narrative   Married, 1 son.   Orig from [facility], went to [place] for a while, relocated to [place] again 2018.   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   No T/A/Ds.   Social Determinants of Health   Financial Resource Strain: Not on file  Food Insecurity: Not on file  Transportation Needs: Not on file  Physical Activity: Not on file  Stress: Not  on file  Social Connections: Not on file  Intimate Partner Violence: Not on file    Outpatient Medications Prior to Visit  Medication Sig Dispense Refill  . gemfibrozil (LOPID) 600 MG tablet Take 1 tablet (600 mg total) by mouth 2 (two) times daily before a meal. 180 tablet 3  . labetalol (NORMODYNE) 200 MG tablet TAKE 1 TABLET BY MOUTH TWICE A DAY 60 tablet 0  . levothyroxine (SYNTHROID) 100 MCG tablet TAKE 1 TABLET BY MOUTH EVERY DAY BEFORE BREAKFAST 90 tablet 1  . metFORMIN (GLUCOPHAGE) 500 MG tablet metformin 500 mg tablet    . pantoprazole (PROTONIX) 40 MG tablet TAKE 1 TABLET BY MOUTH EVERY DAY 30 tablet 0  . Prenatal Vit-Fe Fumarate-FA (PRENATAL COMPLETE PO) Take by mouth daily.    . medroxyPROGESTERone (PROVERA) 10 MG tablet Take 10 mg by mouth daily. (Patient not taking: Reported on 02/27/2021)     No facility-administered medications prior to visit.    No Known Allergies  ROS Review of Systems  Constitutional: Negative for appetite change, chills, fatigue and fever.  HENT: Negative for congestion, dental problem, ear pain and sore throat.   Eyes: Negative for discharge, redness and visual disturbance.  Respiratory: Negative for cough, chest tightness, shortness of breath and wheezing.   Cardiovascular: Negative for chest pain, palpitations and leg swelling.  Gastrointestinal: Negative for abdominal pain, blood in stool, diarrhea, nausea and vomiting.  Genitourinary: Negative for difficulty urinating, dysuria, flank pain, frequency, hematuria and urgency.  Musculoskeletal: Negative for arthralgias, back pain, joint swelling, myalgias and neck stiffness.  Skin: Negative for pallor and rash.  Neurological: Negative for dizziness, speech difficulty, weakness and headaches.  Hematological: Negative for adenopathy. Does not bruise/bleed easily.  Psychiatric/Behavioral: Negative for confusion and sleep disturbance. The patient is not nervous/anxious.     PE; Vitals with BMI  02/27/2021 08/11/2020 02/05/2020  Height 5\' 7"  5\' 7"  5\' 7"   Weight 196 lbs 6 oz 214 lbs 13 oz 216 lbs 3 oz  BMI 30.75 01.09 32.35  Systolic 573 220 254  Diastolic 78 68 73  Pulse 54 66 61   Exam chaperoned by cma Gen: Alert, well appearing.  Patient is oriented to person, place, time, and situation. AFFECT: pleasant, lucid thought and speech. ENT: Ears: EACs clear, normal epithelium.  TMs with good light reflex and landmarks bilaterally.  Eyes: no injection, icteris, swelling, or exudate.  EOMI, PERRLA. Nose: no drainage or turbinate edema/swelling.  No injection or focal lesion.  Mouth: lips without lesion/swelling.  Oral mucosa pink and moist.  Dentition intact and without obvious caries or gingival swelling.  Oropharynx without erythema, exudate, or swelling.  Neck: supple/nontender.  No LAD, mass, or TM.  Carotid pulses 2+ bilaterally, without bruits. CV: RRR, no m/r/g.   LUNGS: CTA bilat, nonlabored  resps, good aeration in all lung fields. ABD: soft, NT, ND, BS normal.  No hepatospenomegaly or mass.  No bruits. EXT: no clubbing, cyanosis, or edema.  Musculoskeletal: no joint swelling, erythema, warmth, or tenderness.  ROM of all joints intact. Skin - no sores or suspicious lesions or rashes or color changes   Pertinent labs:  Lab Results  Component Value Date   TSH 1.79 08/19/2020   Lab Results  Component Value Date   WBC 6.9 02/05/2020   HGB 13.1 02/05/2020   HCT 39.9 02/05/2020   MCV 86.0 02/05/2020   PLT 263 02/05/2020   Lab Results  Component Value Date   CREATININE 0.85 08/19/2020   BUN 18 08/19/2020   NA 139 08/19/2020   K 4.5 08/19/2020   CL 104 08/19/2020   CO2 24 08/19/2020   Lab Results  Component Value Date   ALT 26 08/19/2020   AST 25 08/19/2020   ALKPHOS 83 08/19/2020   BILITOT 0.4 08/19/2020   Lab Results  Component Value Date   CHOL 192 08/19/2020   Lab Results  Component Value Date   HDL 31.80 (L) 08/19/2020   Lab Results  Component Value  Date   LDLCALC 123 (H) 02/05/2020   Lab Results  Component Value Date   TRIG 312.0 (H) 08/19/2020   Lab Results  Component Value Date   CHOLHDL 6 08/19/2020   Lab Results  Component Value Date   HGBA1C 5.5 08/19/2020   Lab Results  Component Value Date   PTH 160 (H) 08/19/2020   CALCIUM 10.9 (H) 08/19/2020   CALCIUM 10.9 (H) 08/19/2020   PHOS 2.7 08/19/2020    ASSESSMENT AND PLAN:   1) HTN: good control on labetalol 200mg  bid. Lytes/cr today.  2) Hypertriglyceridemia: cont gemfibrozil 600mg  bid. FLP and hepatic panel today.  3) Hypothyroidism: cont 100 mcg levothyroxine qd. TSH check today.  4) Primary hyperparathyroidism: check intact PTH and calcium today. Avoid Ca and vit D supplements.  5) PCOS: metformin managed by her infertility MD, who is in the midst of doing another blood and imaging w/u on her. Great wt loss the last 6 mo. Hba1c and fasting glucose today.  6) Health maintenance exam: Reviewed age and gender appropriate health maintenance issues (prudent diet, regular exercise, health risks of tobacco and excessive alcohol, use of seatbelts, fire alarms in home, use of sunscreen).  Also reviewed age and gender appropriate health screening as well as vaccine recommendations. Vaccines: ALL UTD. Labs: fasting HP labs, Hba1c, intact PTH with ca++. Cervical ca screening: per GYN MD.  An After Visit Summary was printed and given to the patient.  FOLLOW UP:  Return in about 6 months (around 08/29/2021).  Signed:  Crissie Sickles, MD           02/27/2021

## 2021-02-27 NOTE — Patient Instructions (Signed)
Health Maintenance, Female Adopting a healthy lifestyle and getting preventive care are important in promoting health and wellness. Ask your health care provider about:  The right schedule for you to have regular tests and exams.  Things you can do on your own to prevent diseases and keep yourself healthy. What should I know about diet, weight, and exercise? Eat a healthy diet  Eat a diet that includes plenty of vegetables, fruits, low-fat dairy products, and lean protein.  Do not eat a lot of foods that are high in solid fats, added sugars, or sodium.   Maintain a healthy weight Body mass index (BMI) is used to identify weight problems. It estimates body fat based on height and weight. Your health care provider can help determine your BMI and help you achieve or maintain a healthy weight. Get regular exercise Get regular exercise. This is one of the most important things you can do for your health. Most adults should:  Exercise for at least 150 minutes each week. The exercise should increase your heart rate and make you sweat (moderate-intensity exercise).  Do strengthening exercises at least twice a week. This is in addition to the moderate-intensity exercise.  Spend less time sitting. Even light physical activity can be beneficial. Watch cholesterol and blood lipids Have your blood tested for lipids and cholesterol at 36 years of age, then have this test every 5 years. Have your cholesterol levels checked more often if:  Your lipid or cholesterol levels are high.  You are older than 36 years of age.  You are at high risk for heart disease. What should I know about cancer screening? Depending on your health history and family history, you may need to have cancer screening at various ages. This may include screening for:  Breast cancer.  Cervical cancer.  Colorectal cancer.  Skin cancer.  Lung cancer. What should I know about heart disease, diabetes, and high blood  pressure? Blood pressure and heart disease  High blood pressure causes heart disease and increases the risk of stroke. This is more likely to develop in people who have high blood pressure readings, are of African descent, or are overweight.  Have your blood pressure checked: ? Every 3-5 years if you are 18-39 years of age. ? Every year if you are 40 years old or older. Diabetes Have regular diabetes screenings. This checks your fasting blood sugar level. Have the screening done:  Once every three years after age 40 if you are at a normal weight and have a low risk for diabetes.  More often and at a younger age if you are overweight or have a high risk for diabetes. What should I know about preventing infection? Hepatitis B If you have a higher risk for hepatitis B, you should be screened for this virus. Talk with your health care provider to find out if you are at risk for hepatitis B infection. Hepatitis C Testing is recommended for:  Everyone born from 1945 through 1965.  Anyone with known risk factors for hepatitis C. Sexually transmitted infections (STIs)  Get screened for STIs, including gonorrhea and chlamydia, if: ? You are sexually active and are younger than 36 years of age. ? You are older than 36 years of age and your health care provider tells you that you are at risk for this type of infection. ? Your sexual activity has changed since you were last screened, and you are at increased risk for chlamydia or gonorrhea. Ask your health care provider   if you are at risk.  Ask your health care provider about whether you are at high risk for HIV. Your health care provider may recommend a prescription medicine to help prevent HIV infection. If you choose to take medicine to prevent HIV, you should first get tested for HIV. You should then be tested every 3 months for as long as you are taking the medicine. Pregnancy  If you are about to stop having your period (premenopausal) and  you may become pregnant, seek counseling before you get pregnant.  Take 400 to 800 micrograms (mcg) of folic acid every day if you become pregnant.  Ask for birth control (contraception) if you want to prevent pregnancy. Osteoporosis and menopause Osteoporosis is a disease in which the bones lose minerals and strength with aging. This can result in bone fractures. If you are 65 years old or older, or if you are at risk for osteoporosis and fractures, ask your health care provider if you should:  Be screened for bone loss.  Take a calcium or vitamin D supplement to lower your risk of fractures.  Be given hormone replacement therapy (HRT) to treat symptoms of menopause. Follow these instructions at home: Lifestyle  Do not use any products that contain nicotine or tobacco, such as cigarettes, e-cigarettes, and chewing tobacco. If you need help quitting, ask your health care provider.  Do not use street drugs.  Do not share needles.  Ask your health care provider for help if you need support or information about quitting drugs. Alcohol use  Do not drink alcohol if: ? Your health care provider tells you not to drink. ? You are pregnant, may be pregnant, or are planning to become pregnant.  If you drink alcohol: ? Limit how much you use to 0-1 drink a day. ? Limit intake if you are breastfeeding.  Be aware of how much alcohol is in your drink. In the [place] one drink equals one 12 oz bottle of beer (355 mL), one 5 oz glass of wine (148 mL), or one 1 oz glass of hard liquor (44 mL). General instructions  Schedule regular health, dental, and eye exams.  Stay current with your vaccines.  Tell your health care provider if: ? You often feel depressed. ? You have ever been abused or do not feel safe at home. Summary  Adopting a healthy lifestyle and getting preventive care are important in promoting health and wellness.  Follow your health care provider's instructions about healthy  diet, exercising, and getting tested or screened for diseases.  Follow your health care provider's instructions on monitoring your cholesterol and blood pressure. This information is not intended to replace advice given to you by your health care provider. Make sure you discuss any questions you have with your health care provider. Document Revised: 11/05/2018 Document Reviewed: 11/05/2018 Elsevier Patient Education  2021 Elsevier Inc.  

## 2021-02-28 LAB — PTH, INTACT AND CALCIUM

## 2021-02-28 LAB — EXTRA SPECIMEN

## 2021-03-14 ENCOUNTER — Other Ambulatory Visit: Payer: Self-pay | Admitting: Family Medicine

## 2021-03-29 ENCOUNTER — Other Ambulatory Visit: Payer: Self-pay | Admitting: Family Medicine

## 2021-04-01 ENCOUNTER — Other Ambulatory Visit: Payer: Self-pay | Admitting: Family Medicine

## 2021-08-21 ENCOUNTER — Telehealth: Payer: Self-pay

## 2021-08-21 MED ORDER — METFORMIN HCL 500 MG PO TABS: ORAL_TABLET | ORAL | 0 refills | Status: DC

## 2021-08-21 NOTE — Telephone Encounter (Signed)
Spoke with pt and she just needs 30 d/s of medication until establishes with new provider. 30 d/[id number] [address] in [place], [place]. [facility] [address]

## 2021-08-21 NOTE — Telephone Encounter (Signed)
Patient refill request.  Patient has now moved to [facility]. Forgot to get this called in before she moved.  [phone]  [id number] [place], [place] - [address]   metFORMIN (GLUCOPHAGE) 500 MG tablet [[id number]]

## 2021-09-12 ENCOUNTER — Other Ambulatory Visit: Payer: Self-pay | Admitting: Family Medicine

## 2021-09-23 ENCOUNTER — Other Ambulatory Visit: Payer: Self-pay | Admitting: Family Medicine

## 2021-10-05 ENCOUNTER — Other Ambulatory Visit: Payer: Self-pay | Admitting: Family Medicine

## 2021-10-25 ENCOUNTER — Telehealth: Payer: Self-pay

## 2021-10-25 NOTE — Telephone Encounter (Signed)
LM for pt to discuss refills. Last spoke with pt 2 months ago, she mentioned she had moved to [place] at that time. [facility] [place] [facility] Pantoprazole, Levothyroxine, Labetalol.

## 2021-10-26 MED ORDER — METFORMIN HCL 500 MG PO TABS: ORAL_TABLET | ORAL | 0 refills | Status: AC

## 2021-10-26 MED ORDER — LABETALOL HCL 200 MG PO TABS: 200.0000 mg | ORAL_TABLET | Freq: Two times a day (BID) | ORAL | 0 refills | Status: AC

## 2021-10-26 MED ORDER — LEVOTHYROXINE SODIUM 100 MCG PO TABS: ORAL_TABLET | ORAL | 0 refills | Status: AC

## 2021-10-26 NOTE — Telephone Encounter (Signed)
Refills sent for 30 d supply. LM for pt to f/u regarding this

## 2021-10-26 NOTE — Telephone Encounter (Signed)
Patient request for another 30 d/s on her meds.  She has an appt scheduled for 11/17/21 with her new primary care provider, she was not able to get in any sooner because she would be a new patient to their office.  Please refill the following prescriptions and send to [facility] in [place], [place].  Pantoprazole, Levothyroxine, Labetalol

## 2021-10-30 NOTE — Telephone Encounter (Signed)
Left detailed message advising refills sent, okay per [facility]

## 2021-11-25 ENCOUNTER — Other Ambulatory Visit: Payer: Self-pay | Admitting: Family Medicine
